# Patient Record
Sex: Male | Born: 1950 | Race: White | Hispanic: No | Marital: Married | Smoking: Never smoker
Health system: Southern US, Community
[De-identification: ages and names within clinical notes are randomized; demographics above are authoritative.]

## PROBLEM LIST (undated history)

## (undated) DIAGNOSIS — R7303 Prediabetes: Secondary | ICD-10-CM

## (undated) DIAGNOSIS — Z87442 Personal history of urinary calculi: Secondary | ICD-10-CM

## (undated) DIAGNOSIS — N289 Disorder of kidney and ureter, unspecified: Secondary | ICD-10-CM

## (undated) DIAGNOSIS — M1990 Unspecified osteoarthritis, unspecified site: Secondary | ICD-10-CM

## (undated) DIAGNOSIS — E785 Hyperlipidemia, unspecified: Secondary | ICD-10-CM

## (undated) DIAGNOSIS — N200 Calculus of kidney: Secondary | ICD-10-CM

## (undated) DIAGNOSIS — E039 Hypothyroidism, unspecified: Secondary | ICD-10-CM

## (undated) DIAGNOSIS — I10 Essential (primary) hypertension: Secondary | ICD-10-CM

## (undated) DIAGNOSIS — G629 Polyneuropathy, unspecified: Secondary | ICD-10-CM

## (undated) HISTORY — PX: BACK SURGERY: SHX140

## (undated) HISTORY — PX: NECK SURGERY: SHX720

## (undated) HISTORY — DX: Hypothyroidism, unspecified: E03.9

## (undated) HISTORY — DX: Polyneuropathy, unspecified: G62.9

## (undated) HISTORY — DX: Hyperlipidemia, unspecified: E78.5

## (undated) HISTORY — DX: Calculus of kidney: N20.0

## (undated) HISTORY — DX: Disorder of kidney and ureter, unspecified: N28.9

## (undated) HISTORY — PX: OTHER SURGICAL HISTORY: SHX169

---

## 2017-09-11 DIAGNOSIS — I10 Essential (primary) hypertension: Secondary | ICD-10-CM | POA: Diagnosis not present

## 2017-09-11 DIAGNOSIS — G629 Polyneuropathy, unspecified: Secondary | ICD-10-CM | POA: Diagnosis not present

## 2017-09-11 DIAGNOSIS — I8393 Asymptomatic varicose veins of bilateral lower extremities: Secondary | ICD-10-CM | POA: Diagnosis not present

## 2017-09-11 DIAGNOSIS — Z9889 Other specified postprocedural states: Secondary | ICD-10-CM | POA: Diagnosis not present

## 2017-09-11 DIAGNOSIS — E039 Hypothyroidism, unspecified: Secondary | ICD-10-CM | POA: Diagnosis not present

## 2017-09-11 DIAGNOSIS — E782 Mixed hyperlipidemia: Secondary | ICD-10-CM | POA: Diagnosis not present

## 2017-09-11 DIAGNOSIS — R600 Localized edema: Secondary | ICD-10-CM | POA: Diagnosis not present

## 2017-09-11 DIAGNOSIS — F5101 Primary insomnia: Secondary | ICD-10-CM | POA: Diagnosis not present

## 2017-09-26 DIAGNOSIS — E782 Mixed hyperlipidemia: Secondary | ICD-10-CM | POA: Diagnosis not present

## 2017-09-26 DIAGNOSIS — M25562 Pain in left knee: Secondary | ICD-10-CM | POA: Diagnosis not present

## 2018-03-10 DIAGNOSIS — J019 Acute sinusitis, unspecified: Secondary | ICD-10-CM | POA: Diagnosis not present

## 2018-06-27 DIAGNOSIS — E782 Mixed hyperlipidemia: Secondary | ICD-10-CM | POA: Diagnosis not present

## 2018-06-27 DIAGNOSIS — F5101 Primary insomnia: Secondary | ICD-10-CM | POA: Diagnosis not present

## 2018-06-27 DIAGNOSIS — E039 Hypothyroidism, unspecified: Secondary | ICD-10-CM | POA: Diagnosis not present

## 2018-06-27 DIAGNOSIS — I10 Essential (primary) hypertension: Secondary | ICD-10-CM | POA: Diagnosis not present

## 2018-06-27 DIAGNOSIS — Z0000 Encounter for general adult medical examination without abnormal findings: Secondary | ICD-10-CM | POA: Diagnosis not present

## 2018-06-27 DIAGNOSIS — Z79899 Other long term (current) drug therapy: Secondary | ICD-10-CM | POA: Diagnosis not present

## 2018-06-27 DIAGNOSIS — Z1159 Encounter for screening for other viral diseases: Secondary | ICD-10-CM | POA: Diagnosis not present

## 2018-06-27 DIAGNOSIS — Z125 Encounter for screening for malignant neoplasm of prostate: Secondary | ICD-10-CM | POA: Diagnosis not present

## 2018-06-27 DIAGNOSIS — G629 Polyneuropathy, unspecified: Secondary | ICD-10-CM | POA: Diagnosis not present

## 2018-06-27 DIAGNOSIS — I8393 Asymptomatic varicose veins of bilateral lower extremities: Secondary | ICD-10-CM | POA: Diagnosis not present

## 2018-10-15 ENCOUNTER — Encounter: Payer: Self-pay | Admitting: Emergency Medicine

## 2018-10-15 ENCOUNTER — Other Ambulatory Visit: Payer: Self-pay

## 2018-10-15 ENCOUNTER — Ambulatory Visit
Admission: EM | Admit: 2018-10-15 | Discharge: 2018-10-15 | Disposition: A | Discharge status: Home / Self Care | Payer: Medicare HMO | Attending: Emergency Medicine | Admitting: Emergency Medicine

## 2018-10-15 DIAGNOSIS — N10 Acute tubulo-interstitial nephritis: Secondary | ICD-10-CM | POA: Insufficient documentation

## 2018-10-15 DIAGNOSIS — B9689 Other specified bacterial agents as the cause of diseases classified elsewhere: Secondary | ICD-10-CM

## 2018-10-15 HISTORY — DX: Disorder of kidney and ureter, unspecified: N28.9

## 2018-10-15 HISTORY — DX: Essential (primary) hypertension: I10

## 2018-10-15 LAB — POCT URINALYSIS DIP (MANUAL ENTRY)
Glucose, UA: NEGATIVE mg/dL
Ketones, POC UA: NEGATIVE mg/dL
Nitrite, UA: POSITIVE — AB
Protein Ur, POC: >=300 mg/dL — AB
Spec Grav, UA: >=1.03 — AB (ref 1.010–1.025)
Urobilinogen, UA: 1 E.U./dL
pH, UA: 5.5 (ref 5.0–8.0)

## 2018-10-15 MED ORDER — CIPROFLOXACIN HCL 500 MG PO TABS: 500.0000 mg | ORAL_TABLET | Freq: Every day | ORAL | 0 refills | Status: AC

## 2018-10-15 MED ORDER — ONDANSETRON 4 MG PO TBDP: 4.0000 mg | ORAL_TABLET | Freq: Three times a day (TID) | ORAL | 0 refills | Status: DC | PRN

## 2018-10-15 NOTE — ED Triage Notes (Addendum)
Patient presents to [facility] after having left sided flank pain on Saturday with a hx of kidney stones. Patient states he has been "out of it" ever since he took his vicodin and sleep pill Saturday to help with the pain.  Patient states his family noted a fever of 101 with chills today, so he came in.  C/o mild cough.  Patient also c/o 5 episodes of emesis in the last 24 hours, and not being able to eat since Saturday.  Unsteady on his feet during ambulation to room.

## 2018-10-15 NOTE — Discharge Instructions (Signed)
Take antibiotics as prescribed: 1 tab once daily. We will call you as soon as you are blood work comes back and may adjust the antibiotic dose frequency. A symptoms do not improve/worsen over the next 24 hours, recommend patient go to ER for further evaluation.

## 2018-10-15 NOTE — ED Notes (Signed)
Patient able to ambulate independently  This RN attempted for blood draw x 2 without success, Shanon Brow, RT made 2 attempts, successful on his second attempt.

## 2018-10-15 NOTE — ED Provider Notes (Signed)
[facility]    CSN: [id number] Arrival date & time: 10/15/18  1844      History   Chief Complaint Chief Complaint  Patient presents with   Fever    HPI Logan Shepherd is a 68 y.o. male with history of hypertension, kidney stones presenting for "feeling off ".  Patient initially complains of left-sided flank pain, though during interview with this provider denies flank pain.  Patient states that pain started out Thursday and left lower quadrant, patient took a leftover Vicodin on Saturday with pain alleviation.  Patient's wife was available for questioning: States he had a temperature of 101F earlier today, has not take in anything: 90.2 Fahrenheit on arrival.  Patient does endorse some nausea and vomiting earlier today without blood or bile.  States he has been able to keep down water: Denies dysuria, urinary frequency/urgency, hematuria.  Patient denies history of diverticulitis, renal failure, diabetes, pyelonephritis.  Patient's wife states that years ago patient had an obstructing renal calculi that required surgical intervention and led to a hospital course in which patient was septic.  Past Medical History:  Diagnosis Date   Hypertension    Renal disorder    kidney stones    There are no active problems to display for this patient.   Past Surgical History:  Procedure Laterality Date   BACK SURGERY     NECK SURGERY         Home Medications    Prior to Admission medications   Medication Sig Start Date End Date Taking? Authorizing Provider  ciprofloxacin (CIPRO) 500 MG tablet Take 1 tablet (500 mg total) by mouth daily with breakfast for 14 days. 10/15/18 10/29/18  Hall-Potvin, [place], PA-C  ondansetron (ZOFRAN ODT) 4 MG disintegrating tablet Take 1 tablet (4 mg total) by mouth every 8 (eight) hours as needed for nausea or vomiting. 10/15/18   Hall-Potvin, [place], PA-C    Family History History reviewed. No pertinent family history.  Social  History Social History   Tobacco Use   Smoking status: Never Smoker   Smokeless tobacco: Never Used  Substance Use Topics   Alcohol use: Not Currently    Frequency: Never   Drug use: Never     Allergies   Codeine, Keflex [cephalexin], Penicillins, and Nubain [nalbuphine]   Review of Systems Review of Systems  Constitutional: Positive for appetite change. Negative for activity change, fatigue and fever.  Respiratory: Negative for cough, shortness of breath and wheezing.   Cardiovascular: Negative for chest pain and palpitations.  Gastrointestinal: Positive for abdominal pain. Negative for abdominal distention, anal bleeding, blood in stool, constipation, diarrhea, nausea, rectal pain and vomiting.  Genitourinary: Negative for decreased urine volume, discharge, dysuria, enuresis, frequency, hematuria, penile pain, penile swelling, scrotal swelling, testicular pain and urgency.  Musculoskeletal: Negative for arthralgias, back pain, myalgias, neck pain and neck stiffness.  Skin: Negative for rash and wound.  Neurological: Positive for tremors. Negative for dizziness, syncope, facial asymmetry, speech difficulty, weakness, light-headedness, numbness and headaches.       Patient has history of tremors, though does state they have worsened  Psychiatric/Behavioral: Negative for confusion and hallucinations. The patient is not nervous/anxious.   All other systems reviewed and are negative.    Physical Exam Triage Vital Signs ED Triage Vitals [10/15/18 1854]  Enc Vitals Group     BP 109/69     Pulse Rate (!) 105     Resp 18     Temp 98.2 F (36.8 C)  Temp Source Oral     SpO2 94 %     Weight      Height      Head Circumference      Peak Flow      Pain Score 0     Pain Loc      Pain Edu?      Excl. in [place]?    No data found.  Updated Vital Signs BP 109/69 (BP Location: Right Arm)    Pulse (!) 105    Temp 98.2 F (36.8 C) (Oral)    Resp 18    SpO2 94%   Visual  Acuity Right Eye Distance:   Left Eye Distance:   Bilateral Distance:    Right Eye Near:   Left Eye Near:    Bilateral Near:     Physical Exam Constitutional:      General: He is not in acute distress. HENT:     Head: Normocephalic and atraumatic.     Mouth/Throat:     Mouth: Mucous membranes are moist.     Pharynx: Oropharynx is clear.  Eyes:     General: No scleral icterus.       Right eye: No discharge.        Left eye: No discharge.     Conjunctiva/sclera: Conjunctivae normal.     Pupils: Pupils are equal, round, and reactive to light.  Cardiovascular:     Rate and Rhythm: Regular rhythm. Tachycardia present.     Heart sounds: Normal heart sounds.  Pulmonary:     Effort: Pulmonary effort is normal. No respiratory distress.     Breath sounds: No wheezing.  Abdominal:     General: Bowel sounds are normal.     Tenderness: There is no right CVA tenderness, left CVA tenderness or guarding.     Comments: Umbilical hyperlipidemia noted: Approximately 3 cm wall deficit with protruding adipose tissue that is reducible, nontender.  Largely stable per patient.  Mild suprapubic tenderness without rebound.  Negative Murphy's, negative Rovsing signs.  Skin:    Capillary Refill: Capillary refill takes less than 2 seconds.     Coloration: Skin is not jaundiced or pale.  Neurological:     Mental Status: He is alert and oriented to person, place, and time.     Deep Tendon Reflexes: Reflexes normal.     Comments: Patient mildly tremulous in upper extremities bilaterally      UC Treatments / Results  Labs (all labs ordered are listed, but only abnormal results are displayed) Labs Reviewed  POCT URINALYSIS DIP (MANUAL ENTRY) - Abnormal; Notable for the following components:      Result Value   Clarity, UA cloudy (*)    Bilirubin, UA small (*)    Spec Grav, UA >=1.030 (*)    Blood, UA large (*)    Protein Ur, POC >=300 (*)    Nitrite, UA Positive (*)    Leukocytes, UA Small (1+)  (*)    All other components within normal limits  NOVEL CORONAVIRUS, NAA  URINE CULTURE  BASIC METABOLIC PANEL    EKG   Radiology No results found.  Procedures Procedures (including critical care time)  Medications Ordered in UC Medications - No data to display  Initial Impression / Assessment and Plan / UC Course  I have reviewed the triage vital signs and the nursing notes.  Pertinent labs & imaging results that were available during my care of the patient were reviewed by me and considered in  my medical decision making (see chart for details).     1.  Acute pyelonephritis POCT urinalysis done in office given history of renal calculi, reviewed by me: Grossly abnormal with small bilirubin, large blood, greater than 300 protein, small WBC, large nitrates.  Discussed possibility of renal calculi, though will treat for pyelonephritis.  Discussed importance of not taking pain medication (Vicodin) at this time as pain over the next 24 hours should be closely monitored, and an indicator to go to the ER for further evaluation/management.  Will treat empirically with ciprofloxacin, urine culture pending.  No previous renal function on file: Patient's wife denies hemodialysis.  States that he did have elevated creatinine in the past, though that was the time of his obstructive kidney stone and he got better.  Patient is unable to recall: This provider will treat at dose appropriate for CrCl <30: 250-500 mg PO q24h as listed below.  Patient's pharmacy close, 500 mg ciprofloxacin administered in office, which patient tolerated well.  BMP pending, will call wife with results tomorrow 8/25.  Discussed assessment/plan with wife who verbalized understanding: will take Cipro 500 mg once daily x14 days, unless renal function is not compromised, which case dose will be increased to 500 mg twice daily x7 days.  Return precautions discussed, patient and wife verbalized understanding and are agreeable to  plan. Final Clinical Impressions(s) / UC Diagnoses   Final diagnoses:  Acute pyelonephritis     Discharge Instructions     Take antibiotics as prescribed: 1 tab once daily. We will call you as soon as you are blood work comes back and may adjust the antibiotic dose frequency. A symptoms do not improve/worsen over the next 24 hours, recommend patient go to ER for further evaluation.    ED Prescriptions    Medication Sig Dispense Auth. Provider   ciprofloxacin (CIPRO) 500 MG tablet Take 1 tablet (500 mg total) by mouth daily with breakfast for 14 days. 14 tablet Hall-Potvin, [place], PA-C   ondansetron (ZOFRAN ODT) 4 MG disintegrating tablet Take 1 tablet (4 mg total) by mouth every 8 (eight) hours as needed for nausea or vomiting. 20 tablet Hall-Potvin, [place], PA-C     Controlled Substance Prescriptions [place] Controlled Substance Registry consulted? Not Applicable   Shea EvansHall-Potvin, Semya Klinke, Cordelia Poche-C 10/15/18 2031

## 2018-10-16 ENCOUNTER — Telehealth: Payer: Self-pay | Admitting: Emergency Medicine

## 2018-10-16 LAB — BASIC METABOLIC PANEL
BUN/Creatinine Ratio: 17 (ref 10–24)
BUN: 55 mg/dL — ABNORMAL HIGH (ref 8–27)
CO2: 20 mmol/L (ref 20–29)
Calcium: 9.9 mg/dL (ref 8.6–10.2)
Chloride: 96 mmol/L (ref 96–106)
Creatinine, Ser: 3.16 mg/dL — ABNORMAL HIGH (ref 0.76–1.27)
GFR calc Af Amer: 22 mL/min/{1.73_m2} — ABNORMAL LOW (ref >59)
GFR calc non Af Amer: 19 mL/min/{1.73_m2} — ABNORMAL LOW (ref >59)
Glucose: 131 mg/dL — ABNORMAL HIGH (ref 65–99)
Potassium: 4.8 mmol/L (ref 3.5–5.2)
Sodium: 132 mmol/L — ABNORMAL LOW (ref 134–144)

## 2018-10-16 LAB — NOVEL CORONAVIRUS, NAA: SARS-CoV-2, NAA: NOT DETECTED

## 2018-10-16 NOTE — Telephone Encounter (Signed)
Spoke with patient's wife, with patient's permission, regarding lab results.  Creatinine 3.16, EGFR 19, bun 55.  Per wife, patient weighs approximately 230 to 235 pounds.  Per Cockcroft-Gault calculation patient's creatinine clearance approximately 33-34 ml/min.  Per wife, patient is feeling a little better since ciprofloxacin dose last night, was able to take a.m. dose this morning after feeling prescription.  Discussed that based on estimated creatinine clearance, patient should be able to tolerate twice daily dosing for the next week.  Again, emphasized strict return precautions.  Patient's wife verbalized understanding, will increase ciprofloxacin dose from 500 mg daily x14 days to 500 mg twice daily x1 days.

## 2018-10-16 NOTE — Telephone Encounter (Signed)
Called patient's PCP office to obtain mobile number as home number goes straight to voicemail.  Left message on both home and mobile number to call back regarding lab results.

## 2018-10-18 LAB — URINE CULTURE: Culture: >=100000 — AB

## 2018-10-19 ENCOUNTER — Telehealth (HOSPITAL_COMMUNITY): Payer: Self-pay | Admitting: Emergency Medicine

## 2018-10-19 NOTE — Telephone Encounter (Signed)
Urine culture was positive for e coli and was given  cipro at urgent care visit. Attempted to reach patient. No answer at this time.   

## 2018-12-28 DIAGNOSIS — G629 Polyneuropathy, unspecified: Secondary | ICD-10-CM | POA: Diagnosis not present

## 2018-12-28 DIAGNOSIS — N1832 Chronic kidney disease, stage 3b: Secondary | ICD-10-CM | POA: Diagnosis not present

## 2018-12-28 DIAGNOSIS — Z23 Encounter for immunization: Secondary | ICD-10-CM | POA: Diagnosis not present

## 2018-12-28 DIAGNOSIS — E039 Hypothyroidism, unspecified: Secondary | ICD-10-CM | POA: Diagnosis not present

## 2018-12-28 DIAGNOSIS — I10 Essential (primary) hypertension: Secondary | ICD-10-CM | POA: Diagnosis not present

## 2018-12-28 DIAGNOSIS — E782 Mixed hyperlipidemia: Secondary | ICD-10-CM | POA: Diagnosis not present

## 2019-02-06 DIAGNOSIS — I10 Essential (primary) hypertension: Secondary | ICD-10-CM | POA: Diagnosis not present

## 2019-03-11 DIAGNOSIS — I10 Essential (primary) hypertension: Secondary | ICD-10-CM | POA: Diagnosis not present

## 2019-04-05 ENCOUNTER — Ambulatory Visit: Payer: Medicare HMO | Attending: Internal Medicine

## 2019-04-05 DIAGNOSIS — Z23 Encounter for immunization: Secondary | ICD-10-CM | POA: Insufficient documentation

## 2019-04-05 DIAGNOSIS — R7401 Elevation of levels of liver transaminase levels: Secondary | ICD-10-CM | POA: Diagnosis not present

## 2019-04-05 NOTE — Progress Notes (Signed)
   Covid-19 Vaccination Clinic  Name:  Logan Shepherd    MRN: [id number] DOB: 06-26-1950  04/05/2019  [name] was observed post Covid-19 immunization for 15 minutes without incidence. He was provided with Vaccine Information Sheet and instruction to access the V-Safe system.   [name] was instructed to call 911 with any severe reactions post vaccine: Marland Kitchen Difficulty breathing  . Swelling of your face and throat  . A fast heartbeat  . A bad rash all over your body  . Dizziness and weakness    Immunizations Administered    Name Date Dose VIS Date Route   Pfizer COVID-19 Vaccine 04/05/2019  3:56 PM 0.3 mL 02/01/2019 Intramuscular   Manufacturer: [employer], [employer]   Lot: [id number]   NDC: [address]-0

## 2019-04-28 ENCOUNTER — Ambulatory Visit: Payer: Medicare HMO | Attending: Internal Medicine

## 2019-04-28 DIAGNOSIS — Z23 Encounter for immunization: Secondary | ICD-10-CM | POA: Insufficient documentation

## 2019-04-28 NOTE — Progress Notes (Signed)
   Covid-19 Vaccination Clinic  Name:  Logan Shepherd    MRN: [id number] DOB: 03-20-1950  04/28/2019  Logan Shepherd was observed post Covid-19 immunization for 15 minutes without incident. He was provided with Vaccine Information Sheet and instruction to access the V-Safe system.   Logan Shepherd was instructed to call 911 with any severe reactions post vaccine: Marland Kitchen Difficulty breathing  . Swelling of face and throat  . A fast heartbeat  . A bad rash all over body  . Dizziness and weakness   Immunizations Administered    Name Date Dose VIS Date Route   Pfizer COVID-19 Vaccine 04/28/2019  1:20 PM 0.3 mL 02/01/2019 Intramuscular   Manufacturer: [employer], [employer]   Lot: [id number]   NDC: [address]-4

## 2019-09-26 DIAGNOSIS — E782 Mixed hyperlipidemia: Secondary | ICD-10-CM | POA: Diagnosis not present

## 2019-09-26 DIAGNOSIS — E039 Hypothyroidism, unspecified: Secondary | ICD-10-CM | POA: Diagnosis not present

## 2019-09-26 DIAGNOSIS — F5101 Primary insomnia: Secondary | ICD-10-CM | POA: Diagnosis not present

## 2019-09-26 DIAGNOSIS — R600 Localized edema: Secondary | ICD-10-CM | POA: Diagnosis not present

## 2019-09-26 DIAGNOSIS — Z125 Encounter for screening for malignant neoplasm of prostate: Secondary | ICD-10-CM | POA: Diagnosis not present

## 2019-09-26 DIAGNOSIS — Z0001 Encounter for general adult medical examination with abnormal findings: Secondary | ICD-10-CM | POA: Diagnosis not present

## 2019-09-26 DIAGNOSIS — R7309 Other abnormal glucose: Secondary | ICD-10-CM | POA: Diagnosis not present

## 2019-09-26 DIAGNOSIS — I10 Essential (primary) hypertension: Secondary | ICD-10-CM | POA: Diagnosis not present

## 2019-09-26 DIAGNOSIS — Z23 Encounter for immunization: Secondary | ICD-10-CM | POA: Diagnosis not present

## 2019-09-26 DIAGNOSIS — Z79899 Other long term (current) drug therapy: Secondary | ICD-10-CM | POA: Diagnosis not present

## 2019-10-22 ENCOUNTER — Ambulatory Visit (HOSPITAL_COMMUNITY)
Admission: RE | Admit: 2019-10-22 | Discharge: 2019-10-22 | Disposition: A | Discharge status: Home / Self Care | Payer: Medicare Other | Source: Ambulatory Visit | Attending: Pulmonary Disease | Admitting: Pulmonary Disease

## 2019-10-22 ENCOUNTER — Other Ambulatory Visit: Payer: Self-pay | Admitting: Nurse Practitioner

## 2019-10-22 ENCOUNTER — Telehealth: Payer: Self-pay | Admitting: Nurse Practitioner

## 2019-10-22 DIAGNOSIS — U071 COVID-19: Secondary | ICD-10-CM | POA: Diagnosis present

## 2019-10-22 DIAGNOSIS — Z23 Encounter for immunization: Secondary | ICD-10-CM | POA: Insufficient documentation

## 2019-10-22 MED ORDER — SODIUM CHLORIDE 0.9 % IV SOLN: INTRAVENOUS | Status: DC | PRN

## 2019-10-22 MED ORDER — EPINEPHRINE 0.3 MG/0.3ML IJ SOAJ: 0.3000 mg | Freq: Once | INTRAMUSCULAR | Status: DC | PRN

## 2019-10-22 MED ORDER — ALBUTEROL SULFATE HFA 108 (90 BASE) MCG/ACT IN AERS: 2.0000 | INHALATION_SPRAY | Freq: Once | RESPIRATORY_TRACT | Status: DC | PRN

## 2019-10-22 MED ORDER — SODIUM CHLORIDE 0.9 % IV SOLN
1200.0000 mg | Freq: Once | INTRAVENOUS | Status: AC
  Administered 2019-10-22: 1200 mg via INTRAVENOUS

## 2019-10-22 MED ORDER — METHYLPREDNISOLONE SODIUM SUCC 125 MG IJ SOLR: 125.0000 mg | Freq: Once | INTRAMUSCULAR | Status: DC | PRN

## 2019-10-22 MED ORDER — FAMOTIDINE IN NACL 20-0.9 MG/50ML-% IV SOLN: 20.0000 mg | Freq: Once | INTRAVENOUS | Status: DC | PRN

## 2019-10-22 MED ORDER — DIPHENHYDRAMINE HCL 50 MG/ML IJ SOLN: 50.0000 mg | Freq: Once | INTRAMUSCULAR | Status: DC | PRN

## 2019-10-22 NOTE — Progress Notes (Signed)
  Diagnosis: COVID-19  Physician:Dr Wright  Procedure: Covid Infusion Clinic Med: casirivimab\imdevimab infusion - Provided patient with casirivimab\imdevimab fact sheet for patients, parents and caregivers prior to infusion.  Complications: No immediate complications noted.  Discharge: Discharged home   Logan Shepherd 10/22/2019  

## 2019-10-22 NOTE — Telephone Encounter (Signed)
I connected by phone with Logan Shepherd on 10/22/2019 at 1:30 PM to discuss the potential use of a new treatment for mild to moderate COVID-19 viral infection in non-hospitalized patients.  This patient is a 69 y.o. male that meets the FDA criteria for Emergency Use Authorization of COVID monoclonal antibody casirivimab/imdevimab.  Has a (+) direct SARS-CoV-2 viral test result  Has mild or moderate COVID-19   Is NOT hospitalized due to COVID-19  Is within 10 days of symptom onset  Has at least one of the high risk factor(s) for progression to severe COVID-19 and/or hospitalization as defined in EUA.  Specific high risk criteria : Older age (>/= 69 yo)   I have spoken and communicated the following to the patient or parent/caregiver regarding COVID monoclonal antibody treatment:  1. FDA has authorized the emergency use for the treatment of mild to moderate COVID-19 in adults and pediatric patients with positive results of direct SARS-CoV-2 viral testing who are 82 years of age and older weighing at least 40 kg, and who are at high risk for progressing to severe COVID-19 and/or hospitalization.  2. The significant known and potential risks and benefits of COVID monoclonal antibody, and the extent to which such potential risks and benefits are unknown.  3. Information on available alternative treatments and the risks and benefits of those alternatives, including clinical trials.  4. Patients treated with COVID monoclonal antibody should continue to self-isolate and use infection control measures (e.g., wear mask, isolate, social distance, avoid sharing personal items, clean and disinfect "high touch" surfaces, and frequent handwashing) according to CDC guidelines.   5. The patient or parent/caregiver has the option to accept or refuse COVID monoclonal antibody treatment.  After reviewing this information with the patient, The patient agreed to proceed with receiving casirivimab\imdevimab  infusion and will be provided a copy of the Fact sheet prior to receiving the infusion.   [name] 10/22/2019 1:30 PM

## 2019-10-22 NOTE — Discharge Instructions (Signed)

## 2019-10-22 NOTE — Progress Notes (Signed)
I connected by phone with Logan Shepherd on 10/22/2019 at 1:32 PM to discuss the potential use of a new treatment for mild to moderate COVID-19 viral infection in non-hospitalized patients.  This patient is a 69 y.o. male that meets the FDA criteria for Emergency Use Authorization of COVID monoclonal antibody casirivimab/imdevimab.  Has a (+) direct SARS-CoV-2 viral test result  Has mild or moderate COVID-19   Is NOT hospitalized due to COVID-19  Is within 10 days of symptom onset  Has at least one of the high risk factor(s) for progression to severe COVID-19 and/or hospitalization as defined in EUA.  Specific high risk criteria : Older age (>/= 69 yo)   I have spoken and communicated the following to the patient or parent/caregiver regarding COVID monoclonal antibody treatment:  1. FDA has authorized the emergency use for the treatment of mild to moderate COVID-19 in adults and pediatric patients with positive results of direct SARS-CoV-2 viral testing who are 64 years of age and older weighing at least 40 kg, and who are at high risk for progressing to severe COVID-19 and/or hospitalization.  2. The significant known and potential risks and benefits of COVID monoclonal antibody, and the extent to which such potential risks and benefits are unknown.  3. Information on available alternative treatments and the risks and benefits of those alternatives, including clinical trials.  4. Patients treated with COVID monoclonal antibody should continue to self-isolate and use infection control measures (e.g., wear mask, isolate, social distance, avoid sharing personal items, clean and disinfect "high touch" surfaces, and frequent handwashing) according to CDC guidelines.   5. The patient or parent/caregiver has the option to accept or refuse COVID monoclonal antibody treatment.  After reviewing this information with the patient, The patient agreed to proceed with receiving casirivimab\imdevimab  infusion and will be provided a copy of the Fact sheet prior to receiving the infusion.   Logan Shepherd 10/22/2019 1:32 PM

## 2020-01-21 ENCOUNTER — Ambulatory Visit: Payer: Medicare HMO | Attending: Internal Medicine

## 2020-01-21 DIAGNOSIS — Z23 Encounter for immunization: Secondary | ICD-10-CM

## 2020-01-21 NOTE — Progress Notes (Signed)
   Covid-19 Vaccination Clinic  Name:  Logan Shepherd    MRN: [id number] DOB: 1950-04-24  01/21/2020  Logan Shepherd was observed post Covid-19 immunization for 15 minutes without incident. He was provided with Vaccine Information Sheet and instruction to access the V-Safe system.   Logan Shepherd was instructed to call 911 with any severe reactions post vaccine: Marland Kitchen Difficulty breathing  . Swelling of face and throat  . A fast heartbeat  . A bad rash all over body  . Dizziness and weakness   Immunizations Administered    Name Date Dose VIS Date Route   Pfizer COVID-19 Vaccine 01/21/2020  2:54 PM 0.3 mL 12/11/2019 Intramuscular   Manufacturer: [employer], [employer]   Lot: [id number]   NDC: [address]-3

## 2020-01-24 DIAGNOSIS — Z23 Encounter for immunization: Secondary | ICD-10-CM | POA: Diagnosis not present

## 2020-03-30 DIAGNOSIS — G629 Polyneuropathy, unspecified: Secondary | ICD-10-CM | POA: Diagnosis not present

## 2020-03-30 DIAGNOSIS — R600 Localized edema: Secondary | ICD-10-CM | POA: Diagnosis not present

## 2020-03-30 DIAGNOSIS — N1832 Chronic kidney disease, stage 3b: Secondary | ICD-10-CM | POA: Diagnosis not present

## 2020-03-30 DIAGNOSIS — F5101 Primary insomnia: Secondary | ICD-10-CM | POA: Diagnosis not present

## 2020-03-30 DIAGNOSIS — E039 Hypothyroidism, unspecified: Secondary | ICD-10-CM | POA: Diagnosis not present

## 2020-03-30 DIAGNOSIS — Z9889 Other specified postprocedural states: Secondary | ICD-10-CM | POA: Diagnosis not present

## 2020-03-30 DIAGNOSIS — I10 Essential (primary) hypertension: Secondary | ICD-10-CM | POA: Diagnosis not present

## 2020-03-30 DIAGNOSIS — E782 Mixed hyperlipidemia: Secondary | ICD-10-CM | POA: Diagnosis not present

## 2020-03-30 DIAGNOSIS — Z79899 Other long term (current) drug therapy: Secondary | ICD-10-CM | POA: Diagnosis not present

## 2020-04-16 ENCOUNTER — Encounter: Payer: Self-pay | Admitting: Neurology

## 2020-06-19 DIAGNOSIS — R7301 Impaired fasting glucose: Secondary | ICD-10-CM | POA: Diagnosis not present

## 2020-06-19 DIAGNOSIS — E7800 Pure hypercholesterolemia, unspecified: Secondary | ICD-10-CM | POA: Diagnosis not present

## 2020-07-08 NOTE — Progress Notes (Deleted)
Assessment/Plan:   1.  Essential Tremor.  -This is evidenced by the symmetrical nature and longstanding hx of gradually getting worse.  We discussed nature and pathophysiology.  We discussed that this can continue to gradually get worse with time.  We discussed that some medications can worsen this, as can caffeine use.  We discussed medication therapy as well as surgical therapy.  Ultimately, the patient decided to ***.     Subjective:   Logan Shepherd was seen in consultation in the movement disorder clinic at the request of Koirala, Dibas, MD.  The evaluation is for tremor.  Medical records made available to me are reviewed.  Tremor started approximately *** ago and involves the ***head and hands.  Tremor is most noticeable when ***.   There is *** family hx of tremor.    Affected by caffeine:  {yes no:314532} Affected by alcohol:  {yes no:314532} Affected by stress:  {yes no:314532} Affected by fatigue:  {yes no:314532} Spills soup if on spoon:  {yes no:314532} Spills glass of liquid if full:  {yes no:314532} Affects ADL's (tying shoes, brushing teeth, etc):  {yes no:314532}  Current/Previously tried tremor medications: ***  Current medications that may exacerbate tremor:  ***  Outside reports reviewed: {Outside review:15817}.  No history of prior neuroimaging of the brain.  Allergies  Allergen Reactions  . Codeine Nausea Only  . Keflex [Cephalexin] Nausea Only  . Penicillins Swelling  . Nubain [Nalbuphine] Anxiety    Current Outpatient Medications  Medication Instructions  . ondansetron (ZOFRAN ODT) 4 mg, Oral, Every 8 hours PRN     Objective:   VITALS:  There were no vitals filed for this visit. Gen:  Appears stated age and in NAD. HEENT:  Normocephalic, atraumatic. The mucous membranes are moist. The superficial temporal arteries are without ropiness or tenderness. Cardiovascular: Regular rate and rhythm. Lungs: Clear to auscultation bilaterally. Neck: There are  no carotid bruits noted bilaterally.  NEUROLOGICAL:  Orientation:  The patient is alert and oriented x 3.   Cranial nerves: There is good facial symmetry. Extraocular muscles are intact and visual fields are full to confrontational testing. Speech is fluent and clear. Soft palate rises symmetrically and there is no tongue deviation. Hearing is intact to conversational tone. Tone: Tone is good throughout. Sensation: Sensation is intact to light touch touch throughout (facial, trunk, extremities). Vibration is intact at the bilateral big toe. There is no extinction with double simultaneous stimulation. There is no sensory dermatomal level identified. Coordination:  The patient has no dysdiadichokinesia or dysmetria. Motor: Strength is 5/5 in the bilateral upper and lower extremities.  Shoulder shrug is equal bilaterally.  There is no pronator drift.  There are no fasciculations noted. DTR's: Deep tendon reflexes are 2/4 at the bilateral biceps, triceps, brachioradialis, patella and achilles.  Plantar responses are downgoing bilaterally. Gait and Station: The patient is able to ambulate without difficulty. The patient is able to heel toe walk without any difficulty. The patient is able to ambulate in a tandem fashion. The patient is able to stand in the Romberg position.   MOVEMENT EXAM: Tremor:  There is *** tremor in the UE, noted most significantly with action.  The patient is *** able to draw Archimedes spirals without significant difficulty.  There is *** tremor at rest.  The patient is *** able to pour water from one glass to another without spilling it.  I have reviewed and interpreted the following labs independently   Chemistry  Component Value Date/Time   NA 132 (L) 10/15/2018 2032   K 4.8 10/15/2018 2032   CL 96 10/15/2018 2032   CO2 20 10/15/2018 2032   BUN 55 (H) 10/15/2018 2032   CREATININE 3.16 (H) 10/15/2018 2032      Component Value Date/Time   CALCIUM 9.9 10/15/2018 2032       No results found for: WBC, HGB, HCT, MCV, PLT No results found for: TSH Note lab work is sent by primary care for review.   Total time spent on today's visit was ***60 minutes, including both face-to-face time and nonface-to-face time.  Time included that spent on review of records (prior notes available to me/labs/imaging if pertinent), discussing treatment and goals, answering patient's questions and coordinating care.  CC:  [place] [place]

## 2020-07-10 ENCOUNTER — Ambulatory Visit: Payer: Medicare HMO | Admitting: Neurology

## 2020-07-21 NOTE — Progress Notes (Signed)
Assessment/Plan:   1.  Cervical Dystonia  -I talked to the patient about the nature and pathophysiology.  The patient is having trouble with ADL's and with rotation of the head in daily life for driving.  The primary muscles involved are the left SCM, right splenius capitis.  We talked about treatments.  We talked about the value of botox.  The patient was educated on the botulinum toxin the black blox warning and given a copy of the botox patient medication guide.  The patient understands that this warning states that there have been reported cases of the Botox extending beyond the injection site and creating adverse effects, similar to those of botulism. This included loss of strength, trouble walking, hoarseness, trouble saying words clearly, loss of bladder control, trouble breathing, trouble swallowing, diplopia, blurry vision and ptosis. Most of the distant spread of Botox was happening in patients, primarily children, who received medication for spasticity or for cervical dystonia. The patient expressed understanding but did not want to proceed right now, primarily because of cost issues.  He can certainly let us know if he would at least like to get prior authorization and go from there.  -Did tell him it is possible that cervical dystonia is related to the multiple neck surgeries.  -The biggest reason to consider Botox would be the very limited head rotation (so much so that he has nearly quit driving).  -Hand tremor is likely dystonic as well.  Did not see a lot of evidence of essential tremor, but given the fact the propranolol worked (but nephrologist did not want him on that), we decided to go ahead and try primidone and see if that would be any of benefit.  We will just start with a very low dose, 50 mg at bedtime.  Discussed risk, benefits, and side effects.  Patient would like to proceed.   Subjective:   Logan Shepherd was seen in consultation in the movement disorder clinic at the  request of Koirala, Dibas, MD.  The evaluation is for tremor.  Medical records made available to me are reviewed.  Tremor started approximately 10+ years ago and involves the head and hands.  He is not sure which started first.  Over the last few months, writing has gotten worse and has to concentrate to write.  The R and left hand shake equally.  He is R hand dominant.  He thinks that head shakes in the "yes" direction.   Has had 4 neck sx and has limited motion in the neck and driving is limited because of that.  There is no family hx of tremor.    Affected by caffeine:  No. (couple cokes per day) Affected by alcohol: doesn't drink alcohol Affected by stress:  No. Affected by fatigue:  No. Spills soup if on spoon:  Probably will Spills glass of liquid if full:  Probably will - uses cups with lipds Affects ADL's (tying shoes, brushing teeth, etc):  No., still shaves some with blade  Current/Previously tried tremor medications: propranolol (was on it in 2015 for several years for tremor but nephrologist d/c it) - it did help   Outside reports reviewed: historical medical records, lab reports, office notes and referral letter/letters.  No history of prior neuroimaging of the brain.  Allergies  Allergen Reactions  . Codeine Nausea Only  . Keflex [Cephalexin] Nausea Only  . Penicillins Swelling  . Nubain [Nalbuphine] Anxiety    Current Outpatient Medications  Medication Instructions  . atorvastatin (LIPITOR) 20  MG tablet 1 tablet  . cyclobenzaprine (FLEXERIL) 5 MG tablet 1 tablet as needed  . furosemide (LASIX) 20 MG tablet 1 tablet, Oral, Daily  . gabapentin (NEURONTIN) 300 MG capsule 1 capsule  . levothyroxine (SYNTHROID) 50 MCG tablet TAKE 1 TABLET IN THE MORNING ON AN EMPTY STOMACH  . lisinopril (ZESTRIL) 10 MG tablet TAKE 1 TABLET EVERY DAY (PLEASE KEEP APPOINTMENT FOR 12/02/2019)     Objective:   VITALS:   Vitals:   07/23/20 1322  BP: 118/62  Pulse: 74  SpO2: 96%  Weight:  244 lb (110.7 kg)  Height: 6' (1.829 m)   Gen:  Appears stated age and in NAD. HEENT:  Normocephalic, atraumatic. The mucous membranes are moist. The superficial temporal arteries are without ropiness or tenderness. Cardiovascular: Regular rate and rhythm. Lungs: Clear to auscultation bilaterally. Neck: There are no carotid bruits noted bilaterally.  Neck is turned to the right and slightly SB to the left.  There is hypertrophy of the left SCM.  Head rotation is limited to the left.  NEUROLOGICAL:  Orientation:  The patient is alert and oriented x 3.   Cranial nerves: There is good facial symmetry. Extraocular muscles are intact and visual fields are full to confrontational testing. Speech is fluent and clear. Soft palate rises symmetrically and there is no tongue deviation. Hearing is intact to conversational tone. Tone: Tone is good throughout. Sensation: Sensation is intact to light touch touch throughout (facial, trunk, extremities). Vibration is intact at the bilateral big toe. There is no extinction with double simultaneous stimulation. There is no sensory dermatomal level identified. Coordination:  The patient has no dysdiadichokinesia or dysmetria. Motor: Strength is 5/5 in the bilateral upper and lower extremities.  Shoulder shrug is equal bilaterally.  There is no pronator drift.  There are no fasciculations noted. DTR's: Deep tendon reflexes are 1/4 at the bilateral biceps, triceps, brachioradialis, patella and achilles.  Plantar responses are downgoing bilaterally. Gait and Station: The patient is able to ambulate without difficulty.   MOVEMENT EXAM: Tremor:  There is min postural tremor.  No significant tremor.  Min trouble with pouring water from one glass to another.   The patient is  able to draw Archimedes spirals without significant difficulty.  There is no tremor at rest.  There is head tremor that is very irregular and jerking in quality.  I have reviewed and interpreted the  following labs independently   Chemistry      Component Value Date/Time   NA 132 (L) 10/15/2018 2032   K 4.8 10/15/2018 2032   CL 96 10/15/2018 2032   CO2 20 10/15/2018 2032   BUN 55 (H) 10/15/2018 2032   CREATININE 3.16 (H) 10/15/2018 2032      Component Value Date/Time   CALCIUM 9.9 10/15/2018 2032      No results found for: WBC, HGB, HCT, MCV, PLT No results found for: TSH Note lab work is sent by primary care for review.   Total time spent on today's visit was 60 minutes, including both face-to-face time and nonface-to-face time.  Time included that spent on review of records (prior notes available to me/labs/imaging if pertinent), discussing treatment and goals, answering patient's questions and coordinating care.  CC:  Koirala, Dibas, MD

## 2020-07-23 ENCOUNTER — Encounter: Payer: Self-pay | Admitting: Neurology

## 2020-07-23 ENCOUNTER — Other Ambulatory Visit: Payer: Self-pay

## 2020-07-23 ENCOUNTER — Ambulatory Visit (INDEPENDENT_AMBULATORY_CARE_PROVIDER_SITE_OTHER): Payer: Medicare HMO | Admitting: Neurology

## 2020-07-23 VITALS — BP 118/62 | HR 74 | Ht 72.0 in | Wt 244.0 lb

## 2020-07-23 DIAGNOSIS — R251 Tremor, unspecified: Secondary | ICD-10-CM | POA: Diagnosis not present

## 2020-07-23 DIAGNOSIS — G243 Spasmodic torticollis: Secondary | ICD-10-CM

## 2020-07-23 MED ORDER — PRIMIDONE 50 MG PO TABS: 50.0000 mg | ORAL_TABLET | Freq: Every day | ORAL | 1 refills | Status: DC

## 2020-07-23 NOTE — Patient Instructions (Addendum)
1.  You have cervical dystonia.  The best treatment for this is botox.  For now, you told me you want to hold off on this.  You can let me know if you change your mind and we can try to get authorization.  2.  Start primidone 50 mg - 1/2 tablet at bedtime for 1 week and then increase to 1 tablet at bedtime thereafter.  The physicians and staff at [facility] are committed to providing excellent care. You may receive a survey requesting feedback about your experience at our office. We strive to receive "very good" responses to the survey questions. If you feel that your experience would prevent you from giving the office a "very good " response, please contact our office to try to remedy the situation. We may be reached at [phone]. Thank you for taking the time out of your busy day to complete the survey.

## 2020-07-24 ENCOUNTER — Telehealth: Payer: Self-pay | Admitting: Neurology

## 2020-07-24 ENCOUNTER — Other Ambulatory Visit: Payer: Self-pay

## 2020-07-24 MED ORDER — PRIMIDONE 50 MG PO TABS: 50.0000 mg | ORAL_TABLET | Freq: Every day | ORAL | 1 refills | Status: DC

## 2020-07-24 NOTE — Telephone Encounter (Signed)
Patient's wife called in stating the primidone was sent to [facility] pharmacy, but they would like it to be sent to the [facility] mail order.

## 2020-07-24 NOTE — Telephone Encounter (Signed)
Primidone sent to [facility]

## 2020-08-14 DIAGNOSIS — M109 Gout, unspecified: Secondary | ICD-10-CM | POA: Diagnosis not present

## 2020-10-15 DIAGNOSIS — Z0001 Encounter for general adult medical examination with abnormal findings: Secondary | ICD-10-CM | POA: Diagnosis not present

## 2020-10-15 DIAGNOSIS — R7309 Other abnormal glucose: Secondary | ICD-10-CM | POA: Diagnosis not present

## 2020-10-15 DIAGNOSIS — R600 Localized edema: Secondary | ICD-10-CM | POA: Diagnosis not present

## 2020-10-15 DIAGNOSIS — Z125 Encounter for screening for malignant neoplasm of prostate: Secondary | ICD-10-CM | POA: Diagnosis not present

## 2020-10-15 DIAGNOSIS — R7303 Prediabetes: Secondary | ICD-10-CM | POA: Diagnosis not present

## 2020-10-15 DIAGNOSIS — E039 Hypothyroidism, unspecified: Secondary | ICD-10-CM | POA: Diagnosis not present

## 2020-10-15 DIAGNOSIS — I10 Essential (primary) hypertension: Secondary | ICD-10-CM | POA: Diagnosis not present

## 2020-10-15 DIAGNOSIS — I8393 Asymptomatic varicose veins of bilateral lower extremities: Secondary | ICD-10-CM | POA: Diagnosis not present

## 2020-10-15 DIAGNOSIS — Z79899 Other long term (current) drug therapy: Secondary | ICD-10-CM | POA: Diagnosis not present

## 2021-01-26 ENCOUNTER — Other Ambulatory Visit: Payer: Self-pay

## 2021-01-26 ENCOUNTER — Ambulatory Visit
Admission: EM | Admit: 2021-01-26 | Discharge: 2021-01-26 | Disposition: A | Discharge status: Home / Self Care | Payer: Medicare HMO | Attending: Physician Assistant | Admitting: Physician Assistant

## 2021-01-26 DIAGNOSIS — M109 Gout, unspecified: Secondary | ICD-10-CM

## 2021-01-26 MED ORDER — PREDNISONE 20 MG PO TABS: 40.0000 mg | ORAL_TABLET | Freq: Every day | ORAL | 0 refills | Status: AC

## 2021-01-26 NOTE — ED Triage Notes (Signed)
Pt c/o pain and swelling to lt foot x2days. Hx of gout.

## 2021-01-26 NOTE — Progress Notes (Signed)
Assessment/Plan:    1.  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  He is possibly considering botox in the future but looking into different insurance plans  2.  F/u 9 months-1 year  Subjective:   Logan Shepherd was seen today in follow up for dystonic tremor.  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  He did not want to trial Botox for cervical dystonia.  We decided to try primidone at low-dose last visit, 50 mg daily.  Pt states that it is really helping - "I can write a check and read it now."  No SE with the medication.    Current prescribed movement disorder medications: Primidone 50 mg daily   PREVIOUS MEDICATIONS: propranolol (was on it in 2015 for several years and it helped, but apparently his nephrologist did not want him on it)  ALLERGIES:   Allergies  Allergen Reactions   Codeine Nausea Only   Keflex [Cephalexin] Nausea Only   Penicillins Swelling   Nubain [Nalbuphine] Anxiety    CURRENT MEDICATIONS:  Outpatient Encounter Medications as of 01/28/2021  Medication Sig   atorvastatin (LIPITOR) 20 MG tablet 1 tablet   cyclobenzaprine (FLEXERIL) 5 MG tablet 1 tablet as needed   furosemide (LASIX) 20 MG tablet Take 1 tablet by mouth daily.   gabapentin (NEURONTIN) 300 MG capsule 1 capsule   levothyroxine (SYNTHROID) 50 MCG tablet TAKE 1 TABLET IN THE MORNING ON AN EMPTY STOMACH   lisinopril (ZESTRIL) 10 MG tablet TAKE 1 TABLET EVERY DAY (PLEASE KEEP APPOINTMENT FOR 12/02/2019)   predniSONE (DELTASONE) 20 MG tablet Take 2 tablets (40 mg total) by mouth daily with breakfast for 5 days.   primidone (MYSOLINE) 50 MG tablet Take 1 tablet (50 mg total) by mouth at bedtime.   No facility-administered encounter medications on file as of 01/28/2021.     Objective:    PHYSICAL EXAMINATION:    VITALS:   Vitals:   01/28/21 1456   BP: 132/83  Pulse: 73  SpO2: 96%  Weight: 251 lb 9.6 oz (114.1 kg)  Height: 5\' 9"  (1.753 m)    Gen:  Appears stated age and in NAD. HEENT:  Normocephalic, atraumatic. The mucous membranes are moist.  Neck: There are no carotid bruits noted bilaterally.  Neck is turned to the right and slightly SB to the left.  There is hypertrophy of the left SCM.  Head rotation is limited to the left.   NEUROLOGICAL:   Orientation:  The patient is alert and oriented x 3.   Cranial nerves: There is good facial symmetry. Extraocular muscles are intact and visual fields are full to confrontational testing. Speech is fluent and clear. Hearing is intact to conversational tone. Sensation: Sensation is intact to light touch touch throughout  Coordination:  The patient has no dysdiadichokinesia or dysmetria. Gait and Station: The patient is able to ambulate without difficulty.    MOVEMENT EXAM: Tremor:  There is no postural tremor or intention tremor today.  No significant tremor.   The patient is  able to draw Archimedes spirals without significant difficulty.  There is no tremor at rest.   I have reviewed and interpreted the following labs independently   Chemistry      Component Value Date/Time   NA 132 (L) 10/15/2018 2032   K 4.8 10/15/2018 2032   CL 96 10/15/2018 2032  CO2 20 10/15/2018 2032   BUN 55 (H) 10/15/2018 2032   CREATININE 3.16 (H) 10/15/2018 2032      Component Value Date/Time   CALCIUM 9.9 10/15/2018 2032      No results found for: WBC, HGB, HCT, MCV, PLT No results found for: TSH   Chemistry      Component Value Date/Time   NA 132 (L) 10/15/2018 2032   K 4.8 10/15/2018 2032   CL 96 10/15/2018 2032   CO2 20 10/15/2018 2032   BUN 55 (H) 10/15/2018 2032   CREATININE 3.16 (H) 10/15/2018 2032      Component Value Date/Time   CALCIUM 9.9 10/15/2018 2032        Cc:  Darrow Bussing, MD

## 2021-01-26 NOTE — ED Provider Notes (Signed)
[facility]    CSN: [id number] Arrival date & time: 01/26/21  1549      History   Chief Complaint Chief Complaint  Patient presents with   Foot Pain    HPI Logan Shepherd is a 70 y.o. male.   Patient here today for evaluation of pain and swelling for the last several days. He reports that pain is present to lateral foot. He states this is similar to prior gout flare. He denies any injury. He has not had any numbness or tingling. Weight bearing makes pain worse.   The history is provided by the patient.  Foot Pain Pertinent negatives include no shortness of breath.   Past Medical History:  Diagnosis Date   Hyperlipidemia    Hypertension    Hypothyroidism    Nephrolithiasis    Neuropathy    Renal insufficiency     Patient Active Problem List   Diagnosis Date Noted   COVID-19 virus infection 10/22/2019    Past Surgical History:  Procedure Laterality Date   BACK SURGERY     lumbar   NECK SURGERY     x 4       Home Medications    Prior to Admission medications   Medication Sig Start Date End Date Taking? Authorizing Provider  predniSONE (DELTASONE) 20 MG tablet Take 2 tablets (40 mg total) by mouth daily with breakfast for 5 days. 01/26/21 01/31/21 Yes Tomi Bamberger, PA-C  atorvastatin (LIPITOR) 20 MG tablet 1 tablet    [provider]  cyclobenzaprine (FLEXERIL) 5 MG tablet 1 tablet as needed 04/10/20   [provider]  furosemide (LASIX) 20 MG tablet Take 1 tablet by mouth daily.    [provider]  gabapentin (NEURONTIN) 300 MG capsule 1 capsule 04/17/19   [provider]  levothyroxine (SYNTHROID) 50 MCG tablet TAKE 1 TABLET IN THE MORNING ON AN EMPTY STOMACH    [provider]  lisinopril (ZESTRIL) 10 MG tablet TAKE 1 TABLET EVERY DAY (PLEASE KEEP APPOINTMENT FOR 12/02/2019)    [provider]  primidone (MYSOLINE) 50 MG tablet Take 1 tablet (50 mg total) by mouth at bedtime. 07/24/20    Tat, Octaviano Batty, DO    Family History Family History  Problem Relation Age of Onset   Kidney Stones Father    CAD Father    Heart attack Brother 2    Social History Social History   Tobacco Use   Smoking status: Never   Smokeless tobacco: Never  Vaping Use   Vaping Use: Never used  Substance Use Topics   Alcohol use: Not Currently   Drug use: Never     Allergies   Codeine, Keflex [cephalexin], Penicillins, and Nubain [nalbuphine]   Review of Systems Review of Systems  Constitutional:  Negative for chills and fever.  Eyes:  Negative for discharge and redness.  Respiratory:  Negative for shortness of breath.   Musculoskeletal:  Positive for arthralgias. Negative for joint swelling.  Skin:  Positive for color change and wound.  Neurological:  Negative for numbness.    Physical Exam Triage Vital Signs ED Triage Vitals [01/26/21 1800]  Enc Vitals Group     BP 135/73     Pulse Rate 70     Resp 18     Temp 98.1 F (36.7 C)     Temp Source Oral     SpO2 94 %     Weight      Height  Head Circumference      Peak Flow      Pain Score 7     Pain Loc      Pain Edu?      Excl. in GC?    No data found.  Updated Vital Signs BP 135/73 (BP Location: Left Arm)   Pulse 70   Temp 98.1 F (36.7 C) (Oral)   Resp 18   SpO2 94%   Physical Exam Vitals and nursing note reviewed.  Constitutional:      General: He is not in acute distress.    Appearance: Normal appearance. He is not ill-appearing.  HENT:     Head: Normocephalic and atraumatic.  Eyes:     Conjunctiva/sclera: Conjunctivae normal.  Cardiovascular:     Rate and Rhythm: Normal rate.  Pulmonary:     Effort: Pulmonary effort is normal.  Musculoskeletal:     Comments: Decreased ROM of left toes due to pain, mild swelling noted diffusely to lateral left foot.   Neurological:     Mental Status: He is alert.  Psychiatric:        Mood and Affect: Mood normal.        Behavior: Behavior normal.         Thought Content: Thought content normal.     UC Treatments / Results  Labs (all labs ordered are listed, but only abnormal results are displayed) Labs Reviewed - No data to display  EKG   Radiology No results found.  Procedures Procedures (including critical care time)  Medications Ordered in UC Medications - No data to display  Initial Impression / Assessment and Plan / UC Course  I have reviewed the triage vital signs and the nursing notes.  Pertinent labs & imaging results that were available during my care of the patient were reviewed by me and considered in my medical decision making (see chart for details).    Redness on prescribed for suspected gout.  Recommend follow-up if symptoms fail to improve or worsen.  Final Clinical Impressions(s) / UC Diagnoses   Final diagnoses:  Acute gout of left foot, unspecified cause   Discharge Instructions   None    ED Prescriptions     Medication Sig Dispense Auth. Provider   predniSONE (DELTASONE) 20 MG tablet Take 2 tablets (40 mg total) by mouth daily with breakfast for 5 days. 10 tablet Tomi Bamberger, PA-C      PDMP not reviewed this encounter.   Tomi Bamberger, PA-C 01/26/21 1848

## 2021-01-28 ENCOUNTER — Encounter: Payer: Self-pay | Admitting: Neurology

## 2021-01-28 ENCOUNTER — Other Ambulatory Visit: Payer: Self-pay

## 2021-01-28 ENCOUNTER — Ambulatory Visit: Payer: Medicare HMO | Admitting: Neurology

## 2021-01-28 VITALS — BP 132/83 | HR 73 | Ht 69.0 in | Wt 251.6 lb

## 2021-01-28 DIAGNOSIS — R251 Tremor, unspecified: Secondary | ICD-10-CM

## 2021-01-28 DIAGNOSIS — G243 Spasmodic torticollis: Secondary | ICD-10-CM | POA: Diagnosis not present

## 2021-01-28 NOTE — Patient Instructions (Addendum)
Merry Christmas!  The physicians and staff at [facility] are committed to providing excellent care. You may receive a survey requesting feedback about your experience at our office. We strive to receive "very good" responses to the survey questions. If you feel that your experience would prevent you from giving the office a "very good " response, please contact our office to try to remedy the situation. We may be reached at [phone]. Thank you for taking the time out of your busy day to complete the survey.  

## 2021-01-29 ENCOUNTER — Other Ambulatory Visit: Payer: Self-pay | Admitting: Neurology

## 2021-01-29 DIAGNOSIS — R251 Tremor, unspecified: Secondary | ICD-10-CM

## 2021-01-29 DIAGNOSIS — G243 Spasmodic torticollis: Secondary | ICD-10-CM

## 2021-02-01 ENCOUNTER — Other Ambulatory Visit: Payer: Self-pay

## 2021-02-12 ENCOUNTER — Other Ambulatory Visit: Payer: Self-pay

## 2021-02-12 ENCOUNTER — Ambulatory Visit
Admission: EM | Admit: 2021-02-12 | Discharge: 2021-02-12 | Disposition: A | Discharge status: Home / Self Care | Payer: Medicare HMO | Attending: Physician Assistant | Admitting: Physician Assistant

## 2021-02-12 DIAGNOSIS — M109 Gout, unspecified: Secondary | ICD-10-CM | POA: Diagnosis not present

## 2021-02-12 MED ORDER — COLCHICINE 0.6 MG PO TABS: 0.6000 mg | ORAL_TABLET | Freq: Every day | ORAL | 0 refills | Status: DC

## 2021-02-12 NOTE — ED Triage Notes (Signed)
Patient presents to Urgent Care with complaints of gout flare-up left foot. He states he was here 12/06 for gout and was prescribed steroids that helped. Treating pain with tylenol.

## 2021-02-12 NOTE — ED Provider Notes (Signed)
[facility]    CSN: [id number] Arrival date & time: 02/12/21  1023      History   Chief Complaint No chief complaint on file.   HPI Logan Shepherd is a 70 y.o. male.   Patient here today for evaluation of possible gout flareup of his left foot.  He states that he was treated for same of couple weeks ago and states that symptoms resolved completely.  Yesterday pain returned to the same area of his left lateral foot.  He has been using Tylenol without significant relief  The history is provided by the patient.   Past Medical History:  Diagnosis Date   Hyperlipidemia    Hypertension    Hypothyroidism    Nephrolithiasis    Neuropathy    Renal insufficiency     Patient Active Problem List   Diagnosis Date Noted   COVID-19 virus infection 10/22/2019    Past Surgical History:  Procedure Laterality Date   BACK SURGERY     lumbar   NECK SURGERY     x 4       Home Medications    Prior to Admission medications   Medication Sig Start Date End Date Taking? Authorizing Provider  colchicine 0.6 MG tablet Take 1 tablet (0.6 mg total) by mouth daily for 7 days. 02/12/21 02/19/21 Yes Tomi Bamberger, PA-C  atorvastatin (LIPITOR) 20 MG tablet 1 tablet    [provider]  cyclobenzaprine (FLEXERIL) 5 MG tablet 1 tablet as needed 04/10/20   [provider]  furosemide (LASIX) 20 MG tablet Take 1 tablet by mouth daily.    [provider]  gabapentin (NEURONTIN) 300 MG capsule 1 capsule 04/17/19   [provider]  levothyroxine (SYNTHROID) 50 MCG tablet TAKE 1 TABLET IN THE MORNING ON AN EMPTY STOMACH    [provider]  lisinopril (ZESTRIL) 10 MG tablet TAKE 1 TABLET EVERY DAY (PLEASE KEEP APPOINTMENT FOR 12/02/2019)    [provider]  primidone (MYSOLINE) 50 MG tablet TAKE 1 TABLET AT BEDTIME 02/01/21   Tat, Octaviano Batty, DO    Family History Family History  Problem Relation Age of Onset   Kidney Stones  Father    CAD Father    Heart attack Brother 66    Social History Social History   Tobacco Use   Smoking status: Never   Smokeless tobacco: Never  Vaping Use   Vaping Use: Never used  Substance Use Topics   Alcohol use: Not Currently   Drug use: Never     Allergies   Codeine, Keflex [cephalexin], Penicillins, and Nubain [nalbuphine]   Review of Systems Review of Systems  Constitutional:  Negative for chills and fever.  Eyes:  Negative for discharge and redness.  Respiratory:  Negative for shortness of breath.   Musculoskeletal:  Positive for arthralgias.  Skin:  Positive for color change and wound.  Neurological:  Negative for numbness.    Physical Exam Triage Vital Signs ED Triage Vitals  Enc Vitals Group     BP 02/12/21 1148 138/72     Pulse Rate 02/12/21 1148 85     Resp 02/12/21 1148 16     Temp 02/12/21 1148 98.8 F (37.1 C)     Temp src --      SpO2 02/12/21 1148 96 %     Weight --      Height --      Head Circumference --      Peak Flow --  Pain Score 02/12/21 1147 7     Pain Loc --      Pain Edu? --      Excl. in GC? --    No data found.  Updated Vital Signs BP 138/72    Pulse 85    Temp 98.8 F (37.1 C)    Resp 16    SpO2 96%       Physical Exam Vitals and nursing note reviewed.  Constitutional:      General: He is not in acute distress.    Appearance: Normal appearance. He is not ill-appearing.  HENT:     Head: Normocephalic and atraumatic.  Eyes:     Conjunctiva/sclera: Conjunctivae normal.  Cardiovascular:     Rate and Rhythm: Normal rate.  Pulmonary:     Effort: Pulmonary effort is normal.  Neurological:     Mental Status: He is alert.     Comments: Sensation intact to left toes  Psychiatric:        Mood and Affect: Mood normal.        Behavior: Behavior normal.        Thought Content: Thought content normal.     UC Treatments / Results  Labs (all labs ordered are listed, but only abnormal results are  displayed) Labs Reviewed - No data to display  EKG   Radiology No results found.  Procedures Procedures (including critical care time)  Medications Ordered in UC Medications - No data to display  Initial Impression / Assessment and Plan / UC Course  I have reviewed the triage vital signs and the nursing notes.  Pertinent labs & imaging results that were available during my care of the patient were reviewed by me and considered in my medical decision making (see chart for details).  Colchicine prescribed for suspected gout.  Discussed that I was avoiding further steroid treatment given most recent steroid burst.  Recommended follow-up if colchicine is not effective.   Final Clinical Impressions(s) / UC Diagnoses   Final diagnoses:  Acute gout of left foot, unspecified cause   Discharge Instructions   None    ED Prescriptions     Medication Sig Dispense Auth. Provider   colchicine 0.6 MG tablet Take 1 tablet (0.6 mg total) by mouth daily for 7 days. 7 tablet Tomi Bamberger, PA-C      PDMP not reviewed this encounter.   Tomi Bamberger, PA-C 02/12/21 1341

## 2021-03-12 DIAGNOSIS — E119 Type 2 diabetes mellitus without complications: Secondary | ICD-10-CM | POA: Diagnosis not present

## 2021-03-12 DIAGNOSIS — N1832 Chronic kidney disease, stage 3b: Secondary | ICD-10-CM | POA: Diagnosis not present

## 2021-03-12 DIAGNOSIS — E1169 Type 2 diabetes mellitus with other specified complication: Secondary | ICD-10-CM | POA: Diagnosis not present

## 2021-03-12 DIAGNOSIS — M109 Gout, unspecified: Secondary | ICD-10-CM | POA: Diagnosis not present

## 2021-03-12 DIAGNOSIS — E7800 Pure hypercholesterolemia, unspecified: Secondary | ICD-10-CM | POA: Diagnosis not present

## 2021-04-09 ENCOUNTER — Telehealth: Payer: Self-pay | Admitting: Neurology

## 2021-04-09 ENCOUNTER — Other Ambulatory Visit: Payer: Self-pay

## 2021-04-09 DIAGNOSIS — R251 Tremor, unspecified: Secondary | ICD-10-CM

## 2021-04-09 DIAGNOSIS — G243 Spasmodic torticollis: Secondary | ICD-10-CM

## 2021-04-09 MED ORDER — PRIMIDONE 50 MG PO TABS: 50.0000 mg | ORAL_TABLET | Freq: Every day | ORAL | 0 refills | Status: DC

## 2021-04-09 NOTE — Telephone Encounter (Signed)
1. Which medications need refilled? (List name and dosage, if known) primidone  2. Which pharmacy/location is medication to be sent to? (include street and city if [facility]) [facility] - new insurance this year. Having to use mail order. They are telling her they need a brand new prescription

## 2021-04-12 ENCOUNTER — Other Ambulatory Visit: Payer: Self-pay

## 2021-04-12 DIAGNOSIS — G243 Spasmodic torticollis: Secondary | ICD-10-CM

## 2021-04-12 DIAGNOSIS — R251 Tremor, unspecified: Secondary | ICD-10-CM

## 2021-04-12 MED ORDER — PRIMIDONE 50 MG PO TABS: 50.0000 mg | ORAL_TABLET | Freq: Every day | ORAL | 0 refills | Status: DC

## 2021-04-12 NOTE — Telephone Encounter (Signed)
Called patients wife and left voicemail message that I have sent the primidone prescription in to [facility] for the patient and to call office back with any other concerns

## 2021-04-13 DIAGNOSIS — M109 Gout, unspecified: Secondary | ICD-10-CM | POA: Diagnosis not present

## 2021-04-13 DIAGNOSIS — Z79899 Other long term (current) drug therapy: Secondary | ICD-10-CM | POA: Diagnosis not present

## 2021-05-31 ENCOUNTER — Ambulatory Visit (HOSPITAL_COMMUNITY)
Admission: EM | Admit: 2021-05-31 | Discharge: 2021-05-31 | Disposition: A | Discharge status: Home / Self Care | Payer: Medicare (Managed Care) | Attending: Emergency Medicine | Admitting: Emergency Medicine

## 2021-05-31 ENCOUNTER — Ambulatory Visit
Admission: EM | Admit: 2021-05-31 | Discharge: 2021-05-31 | Disposition: A | Discharge status: Home / Self Care | Payer: Medicare (Managed Care)

## 2021-05-31 ENCOUNTER — Encounter (HOSPITAL_COMMUNITY): Payer: Self-pay

## 2021-05-31 DIAGNOSIS — R062 Wheezing: Secondary | ICD-10-CM

## 2021-05-31 DIAGNOSIS — J0110 Acute frontal sinusitis, unspecified: Secondary | ICD-10-CM

## 2021-05-31 MED ORDER — BENZONATATE 100 MG PO CAPS: 100.0000 mg | ORAL_CAPSULE | Freq: Three times a day (TID) | ORAL | 0 refills | Status: DC

## 2021-05-31 MED ORDER — DOXYCYCLINE HYCLATE 100 MG PO CAPS: 100.0000 mg | ORAL_CAPSULE | Freq: Two times a day (BID) | ORAL | 0 refills | Status: AC

## 2021-05-31 MED ORDER — ALBUTEROL SULFATE HFA 108 (90 BASE) MCG/ACT IN AERS: 2.0000 | INHALATION_SPRAY | RESPIRATORY_TRACT | 0 refills | Status: AC | PRN

## 2021-05-31 NOTE — ED Provider Notes (Signed)
?[facility] ? ? ? ?CSN: [id number] ?Arrival date & time: 05/31/21  1743 ? ? ?  ? ?History   ?Chief Complaint ?Chief Complaint  ?Patient presents with  ? URI  ? ? ?HPI ?Logan Shepherd PaymentCurtis Shepherd is a 71 y.o. male.  ? ?Patient presents with headache, nasal congestion, sinus pressure, post-nasal drainage, and productive cough for the past week or so. He reports intermittent fever. He states he watches his grandkids who were recently sick with colds and ear infections. He and his wife subsequently developed colds as well. The patient has tried some OTC medicine with minimal improvement. He reports some intermittent wheezing but denies history of asthma.  ? ?The history is provided by the patient.  ?URI ?Presenting symptoms: congestion, cough, fatigue and fever   ?Presenting symptoms: no ear pain and no sore throat   ?Associated symptoms: headaches and wheezing   ?Associated symptoms: no myalgias   ? ?Past Medical History:  ?Diagnosis Date  ? Hyperlipidemia   ? Hypertension   ? Hypothyroidism   ? Nephrolithiasis   ? Neuropathy   ? Renal insufficiency   ? ? ?Patient Active Problem List  ? Diagnosis Date Noted  ? COVID-19 virus infection 10/22/2019  ? ? ?Past Surgical History:  ?Procedure Laterality Date  ? BACK SURGERY    ? lumbar  ? NECK SURGERY    ? x 4  ? ? ? ? ? ?Home Medications   ? ?Prior to Admission medications   ?Medication Sig Start Date End Date Taking? Authorizing Provider  ?albuterol (VENTOLIN HFA) 108 (90 Base) MCG/ACT inhaler Inhale 2 puffs into the lungs every 4 (four) hours as needed for wheezing or shortness of breath. 05/31/21  Yes Izzac Rockett L, PA  ?benzonatate (TESSALON) 100 MG capsule Take 1 capsule (100 mg total) by mouth every 8 (eight) hours. 05/31/21  Yes Shandell Giovanni L, PA  ?doxycycline (VIBRAMYCIN) 100 MG capsule Take 1 capsule (100 mg total) by mouth 2 (two) times daily for 7 days. 05/31/21 06/07/21 Yes Sultan Pargas L, PA  ?atorvastatin (LIPITOR) 20 MG tablet 1 tablet    [provider]  ?colchicine 0.6 MG tablet Take 1 tablet (0.6 mg total) by mouth daily for 7 days. 02/12/21 02/19/21  Tomi BambergerMyers, Rebecca F, PA-C  ?cyclobenzaprine (FLEXERIL) 5 MG tablet 1 tablet as needed 04/10/20   [provider]  ?furosemide (LASIX) 20 MG tablet Take 1 tablet by mouth daily.    [provider]  ?gabapentin (NEURONTIN) 300 MG capsule 1 capsule 04/17/19   [provider]  ?levothyroxine (SYNTHROID) 50 MCG tablet TAKE 1 TABLET IN THE MORNING ON AN EMPTY STOMACH    [provider]  ?lisinopril (ZESTRIL) 10 MG tablet TAKE 1 TABLET EVERY DAY (PLEASE KEEP APPOINTMENT FOR 12/02/2019)    [provider]  ?primidone (MYSOLINE) 50 MG tablet Take 1 tablet (50 mg total) by mouth daily. 04/12/21   Vladimir Fasterat, Rebecca S, DO  ? ? ?Family History ?Family History  ?Problem Relation Age of Onset  ? Kidney Stones Father   ? CAD Father   ? Heart attack Brother 50  ? ? ?Social History ?Social History  ? ?Tobacco Use  ? Smoking status: Never  ? Smokeless tobacco: Never  ?Vaping Use  ? Vaping Use: Never used  ?Substance Use Topics  ? Alcohol use: Not Currently  ? Drug use: Never  ? ? ? ?Allergies   ?Codeine, Keflex [cephalexin], Penicillins, and Nubain [nalbuphine] ? ? ?Review of Systems ?Review of  Systems  ?Constitutional:  Positive for fatigue and fever.  ?HENT:  Positive for congestion, postnasal drip and sinus pressure. Negative for ear pain and sore throat.   ?Respiratory:  Positive for cough and wheezing. Negative for chest tightness and shortness of breath.   ?Cardiovascular:  Negative for chest pain.  ?Gastrointestinal:  Negative for nausea and vomiting.  ?Musculoskeletal:  Negative for myalgias.  ?Skin:  Negative for rash.  ?Neurological:  Positive for headaches. Negative for dizziness.  ? ? ?Physical Exam ?Triage Vital Signs ?ED Triage Vitals  ?Enc Vitals Group  ?   BP 05/31/21 1846 124/68  ?   Pulse Rate 05/31/21 1845 80  ?   Resp 05/31/21 1845 20  ?   Temp 05/31/21 1845 98.2 ?F (36.8 ?C)  ?    Temp Source 05/31/21 1845 Oral  ?   SpO2 05/31/21 1845 94 %  ?   Weight --   ?   Height --   ?   Head Circumference --   ?   Peak Flow --   ?   Pain Score 05/31/21 1845 0  ?   Pain Loc --   ?   Pain Edu? --   ?   Excl. in [place]? --   ? ?No data found. ? ?Updated Vital Signs ?BP 124/68   Pulse 80   Temp 98.2 ?F (36.8 ?C) (Oral)   Resp 20   SpO2 94%  ? ?Visual Acuity ?Right Eye Distance:   ?Left Eye Distance:   ?Bilateral Distance:   ? ?Right Eye Near:   ?Left Eye Near:    ?Bilateral Near:    ? ?Physical Exam ?Vitals and nursing note reviewed.  ?Constitutional:   ?   General: He is not in acute distress. ?HENT:  ?   Head: Normocephalic.  ?   Nose:  ?   Right Sinus: Maxillary sinus tenderness and frontal sinus tenderness present.  ?   Left Sinus: Maxillary sinus tenderness and frontal sinus tenderness present.  ?Eyes:  ?   Pupils: Pupils are equal, round, and reactive to light.  ?Cardiovascular:  ?   Rate and Rhythm: Normal rate and regular rhythm.  ?   Heart sounds: Normal heart sounds.  ?Pulmonary:  ?   Effort: Pulmonary effort is normal.  ?   Breath sounds: Wheezing (mild) and rhonchi (clear with coughing) present. No rales.  ?Lymphadenopathy:  ?   Cervical: No cervical adenopathy.  ?Skin: ?   Findings: No rash.  ?Neurological:  ?   Mental Status: He is alert.  ?Psychiatric:     ?   Mood and Affect: Mood normal.  ? ? ? ?UC Treatments / Results  ?Labs ?(all labs ordered are listed, but only abnormal results are displayed) ?Labs Reviewed - No data to display ? ?EKG ? ? ?Radiology ?No results found. ? ?Procedures ?Procedures (including critical care time) ? ?Medications Ordered in UC ?Medications - No data to display ? ?Initial Impression / Assessment and Plan / UC Course  ?I have reviewed the triage vital signs and the nursing notes. ? ?Pertinent labs & imaging results that were available during my care of the patient were reviewed by me and considered in my medical decision making (see chart for details). ? ?   ? ?S/s consistent with sinusitis - duration of about a week with reworsening and fever. Empiric doxy due to pcn allergy. Sx tx with Tessalon and inhaler for wheezing. Discussed ER and return precautions.  ? ?E/M: 1 acute  uncomplicated illness, no data, moderate risk due to prescription management ? ?Final Clinical Impressions(s) / UC Diagnoses  ? ?Final diagnoses:  ?Acute non-recurrent frontal sinusitis  ?Wheezing  ? ? ? ?Discharge Instructions   ? ?  ?Take antibiotics as prescribed. Take Tessalon as prescribed as needed for cough and inhaler for wheezing. Rest and keep hydrated. Follow-up with PCP if no improvement after completing antibiotics.  ?Go to the ER if develop difficulty breathing.  ? ? ? ?ED Prescriptions   ? ? Medication Sig Dispense Auth. Provider  ? doxycycline (VIBRAMYCIN) 100 MG capsule Take 1 capsule (100 mg total) by mouth 2 (two) times daily for 7 days. 14 capsule Vallery Sa, Handy Mcloud L, PA  ? benzonatate (TESSALON) 100 MG capsule Take 1 capsule (100 mg total) by mouth every 8 (eight) hours. 21 capsule Vallery Sa, Charyl Minervini L, PA  ? albuterol (VENTOLIN HFA) 108 (90 Base) MCG/ACT inhaler Inhale 2 puffs into the lungs every 4 (four) hours as needed for wheezing or shortness of breath. 1 each Estanislado Pandy, PA  ? ?  ? ?PDMP not reviewed this encounter. ?  Estanislado Pandy, [place] ?05/31/21 1947 ? ?

## 2021-05-31 NOTE — ED Triage Notes (Signed)
Pt presents with congestion, post nasal drainage, and chills X 5 days ?

## 2021-05-31 NOTE — Discharge Instructions (Addendum)
Take antibiotics as prescribed. Take Tessalon as prescribed as needed for cough and inhaler for wheezing. Rest and keep hydrated. Follow-up with PCP if no improvement after completing antibiotics.  ?Go to the ER if develop difficulty breathing.  ?

## 2021-06-01 ENCOUNTER — Ambulatory Visit: Payer: Self-pay

## 2021-06-18 ENCOUNTER — Ambulatory Visit (INDEPENDENT_AMBULATORY_CARE_PROVIDER_SITE_OTHER): Payer: Medicare (Managed Care)

## 2021-06-18 ENCOUNTER — Encounter (HOSPITAL_COMMUNITY): Payer: Self-pay

## 2021-06-18 ENCOUNTER — Ambulatory Visit (HOSPITAL_COMMUNITY)
Admission: EM | Admit: 2021-06-18 | Discharge: 2021-06-18 | Disposition: A | Discharge status: Home / Self Care | Payer: Medicare (Managed Care) | Attending: Family Medicine | Admitting: Family Medicine

## 2021-06-18 DIAGNOSIS — Z880 Allergy status to penicillin: Secondary | ICD-10-CM | POA: Insufficient documentation

## 2021-06-18 DIAGNOSIS — J168 Pneumonia due to other specified infectious organisms: Secondary | ICD-10-CM | POA: Insufficient documentation

## 2021-06-18 DIAGNOSIS — R519 Headache, unspecified: Secondary | ICD-10-CM | POA: Diagnosis not present

## 2021-06-18 DIAGNOSIS — R059 Cough, unspecified: Secondary | ICD-10-CM | POA: Diagnosis not present

## 2021-06-18 DIAGNOSIS — J9811 Atelectasis: Secondary | ICD-10-CM | POA: Insufficient documentation

## 2021-06-18 DIAGNOSIS — J4521 Mild intermittent asthma with (acute) exacerbation: Secondary | ICD-10-CM | POA: Diagnosis not present

## 2021-06-18 DIAGNOSIS — R0600 Dyspnea, unspecified: Secondary | ICD-10-CM

## 2021-06-18 DIAGNOSIS — Z20822 Contact with and (suspected) exposure to covid-19: Secondary | ICD-10-CM | POA: Diagnosis not present

## 2021-06-18 DIAGNOSIS — J189 Pneumonia, unspecified organism: Secondary | ICD-10-CM

## 2021-06-18 MED ORDER — LEVOFLOXACIN 250 MG PO TABS: ORAL_TABLET | ORAL | 0 refills | Status: DC

## 2021-06-18 MED ORDER — TRIAMCINOLONE ACETONIDE 40 MG/ML IJ SUSP
40.0000 mg | Freq: Once | INTRAMUSCULAR | Status: AC
Start: 2021-06-18 — End: 2021-06-18
  Administered 2021-06-18: 40 mg via INTRAMUSCULAR

## 2021-06-18 MED ORDER — BENZONATATE 100 MG PO CAPS: 100.0000 mg | ORAL_CAPSULE | Freq: Three times a day (TID) | ORAL | 0 refills | Status: DC | PRN

## 2021-06-18 MED ORDER — TRIAMCINOLONE ACETONIDE 40 MG/ML IJ SUSP
INTRAMUSCULAR | Status: AC
  Filled 2021-06-18: qty 1

## 2021-06-18 NOTE — Discharge Instructions (Addendum)
There is a possible early pneumonia on your chest x-ray on the left lower side ? ?Take benzonatate 100 mg--1 capsule 3 times daily as needed for cough ? ?Take Levaquin 250 mg according to the following instructions (this is adjusted for your kidney function)--2 tabs today orally.  Then you will take 1 tablet every 48 hours for 3 more doses total ? ?Get seen on an urgent basis if you are feeling worse ? ? ?You have been swabbed for COVID, and the test will result in the next 24 hours. Our staff will call you if positive. If the test is positive, you should quarantine for 5 days.  ?

## 2021-06-18 NOTE — ED Provider Notes (Addendum)
?[facility] ? ? ? ?CSN: [id number] ?Arrival date & time: 06/18/21  1251 ? ? ?  ? ?History   ?Chief Complaint ?Chief Complaint  ?Patient presents with  ? Facial Pain  ? ? ?HPI ?Logan Shepherd is a 71 y.o. male.  ? ?HPI ?Here for 3-day history of cough, postnasal drainage and throat irritation.  He has had some chills but no fever.  He had diarrhea about 6 times yesterday and 3 times today so far.  No nausea or vomiting.  He has a headache mainly in his frontal and maxillary areas.  He has a good bit of shortness of breath now, though he does not hear any wheezing at this time ? ?He was seen here April 10 and received treatment with doxycycline for possible sinusitis.  His congestion and wheezing all improved.  He had about 1 to 2 days without any symptoms and then began getting sick again 3 days ago ? ?Past Medical History:  ?Diagnosis Date  ? Hyperlipidemia   ? Hypertension   ? Hypothyroidism   ? Nephrolithiasis   ? Neuropathy   ? Renal insufficiency   ? ? ?Patient Active Problem List  ? Diagnosis Date Noted  ? COVID-19 virus infection 10/22/2019  ? ? ?Past Surgical History:  ?Procedure Laterality Date  ? BACK SURGERY    ? lumbar  ? NECK SURGERY    ? x 4  ? ? ? ? ? ?Home Medications   ? ?Prior to Admission medications   ?Medication Sig Start Date End Date Taking? Authorizing Provider  ?benzonatate (TESSALON) 100 MG capsule Take 1 capsule (100 mg total) by mouth 3 (three) times daily as needed for cough. 06/18/21  Yes Barrett Henle, MD  ?levofloxacin (LEVAQUIN) 250 MG tablet 2 tabs orally the first day, then 1 tablet every 48 hours for 3 more doses(dosing for eGFR approx 19) 06/18/21  Yes Onyekachi Gathright, Gwenlyn Perking, MD  ?albuterol (VENTOLIN HFA) 108 (90 Base) MCG/ACT inhaler Inhale 2 puffs into the lungs every 4 (four) hours as needed for wheezing or shortness of breath. 05/31/21   Abner Greenspan, Amy L, PA  ?atorvastatin (LIPITOR) 20 MG tablet 1 tablet    [provider]  ?colchicine 0.6 MG tablet Take 1  tablet (0.6 mg total) by mouth daily for 7 days. 02/12/21 02/19/21  Francene Finders, PA-C  ?cyclobenzaprine (FLEXERIL) 5 MG tablet 1 tablet as needed 04/10/20   [provider]  ?furosemide (LASIX) 20 MG tablet Take 1 tablet by mouth daily.    [provider]  ?gabapentin (NEURONTIN) 300 MG capsule 1 capsule 04/17/19   [provider]  ?levothyroxine (SYNTHROID) 50 MCG tablet TAKE 1 TABLET IN THE MORNING ON AN EMPTY STOMACH    [provider]  ?lisinopril (ZESTRIL) 10 MG tablet TAKE 1 TABLET EVERY DAY (PLEASE KEEP APPOINTMENT FOR 12/02/2019)    [provider]  ?primidone (MYSOLINE) 50 MG tablet Take 1 tablet (50 mg total) by mouth daily. 04/12/21   Ludwig Clarks, DO  ? ? ?Family History ?Family History  ?Problem Relation Age of Onset  ? Kidney Stones Father   ? CAD Father   ? Heart attack Brother 63  ? ? ?Social History ?Social History  ? ?Tobacco Use  ? Smoking status: Never  ? Smokeless tobacco: Never  ?Vaping Use  ? Vaping Use: Never used  ?Substance Use Topics  ? Alcohol use: Not Currently  ? Drug use: Never  ? ? ? ?Allergies   ?  Codeine, Keflex [cephalexin], Penicillins, and Nubain [nalbuphine] ? ? ?Review of Systems ?Review of Systems ? ? ?Physical Exam ?Triage Vital Signs ?ED Triage Vitals [06/18/21 1354]  ?Enc Vitals Group  ?   BP (!) 169/79  ?   Pulse Rate 65  ?   Resp 16  ?   Temp 98.3 ?F (36.8 ?C)  ?   Temp Source Oral  ?   SpO2 96 %  ?   Weight   ?   Height   ?   Head Circumference   ?   Peak Flow   ?   Pain Score   ?   Pain Loc   ?   Pain Edu?   ?   Excl. in [place]?   ? ?No data found. ? ?Updated Vital Signs ?BP (!) 169/79 (BP Location: Left Arm)   Pulse 65   Temp 98.3 ?F (36.8 ?C) (Oral)   Resp 16   SpO2 96%  ? ?Visual Acuity ?Right Eye Distance:   ?Left Eye Distance:   ?Bilateral Distance:   ? ?Right Eye Near:   ?Left Eye Near:    ?Bilateral Near:    ? ?Physical Exam ?Vitals reviewed.  ?Constitutional:   ?   General: He is not in acute distress. ?    Appearance: He is not toxic-appearing.  ?HENT:  ?   Right Ear: Tympanic membrane and ear canal normal.  ?   Left Ear: Tympanic membrane and ear canal normal.  ?   Nose: Nose normal.  ?   Mouth/Throat:  ?   Mouth: Mucous membranes are moist.  ?   Pharynx: No posterior oropharyngeal erythema.  ?   Comments: Clear mucus is draining in the oropharynx ?Eyes:  ?   Extraocular Movements: Extraocular movements intact.  ?   Conjunctiva/sclera: Conjunctivae normal.  ?   Pupils: Pupils are equal, round, and reactive to light.  ?Cardiovascular:  ?   Rate and Rhythm: Normal rate and regular rhythm.  ?   Heart sounds: No murmur heard. ?Pulmonary:  ?   Effort: Pulmonary effort is normal.  ?   Breath sounds: Normal breath sounds.  ?Musculoskeletal:  ?   Cervical back: Neck supple.  ?Lymphadenopathy:  ?   Cervical: No cervical adenopathy.  ?Skin: ?   Capillary Refill: Capillary refill takes less than 2 seconds.  ?   Coloration: Skin is not jaundiced or pale.  ?Neurological:  ?   General: No focal deficit present.  ?   Mental Status: He is alert and oriented to person, place, and time.  ?   Comments: Fine tremor of his hands and head is noted  ?Psychiatric:     ?   Behavior: Behavior normal.  ? ? ? ?UC Treatments / Results  ?Labs ?(all labs ordered are listed, but only abnormal results are displayed) ?Labs Reviewed  ?SARS CORONAVIRUS 2 (TAT 6-24 HRS)  ? ? ?EKG ? ? ?Radiology ?DG Chest 2 View ? ?Result Date: 06/18/2021 ?CLINICAL DATA:  Dyspnea.  Cough.  Headache.  Symptoms for 3 days. EXAM: CHEST - 2 VIEW COMPARISON:  None. FINDINGS: The cardiomediastinal contours are normal. Subsegmental opacities at the left lung base typical of atelectasis. Pulmonary vasculature is normal. No consolidation, pleural effusion, or pneumothorax. Spinal stimulator in place. No acute osseous abnormalities are seen. IMPRESSION: Subsegmental atelectasis at the left lung base. Electronically Signed   By: Keith Rake M.D.   On: 06/18/2021 14:48    ? ?Procedures ?Procedures (including critical care time) ? ?  Medications Ordered in UC ?Medications  ?triamcinolone acetonide (KENALOG-40) injection 40 mg (has no administration in time range)  ? ? ?Initial Impression / Assessment and Plan / UC Course  ?I have reviewed the triage vital signs and the nursing notes. ? ?Pertinent labs & imaging results that were available during my care of the patient were reviewed by me and considered in my medical decision making (see chart for details). ? ?  ? ?Chest x-ray shows some possible atelectasis in the left lower lobe.  We will treat for possible developing pneumonia.  We will swab for COVID. And if he is positive he is at high risk for severe disease, I would do him a prescription of molnupiravir.  His creatinine is 3.16 on last lab done in epic.  eGFR when last time was about 20. Will do renal adjusted dose of his levaquin (is allergic to penicillin) ? ? ?Final Clinical Impressions(s) / UC Diagnoses  ? ?Final diagnoses:  ?Pneumonia of left lower lobe due to infectious organism  ?Mild intermittent asthma with acute exacerbation  ? ? ? ?Discharge Instructions   ? ?  ?There is a possible early pneumonia on your chest x-ray on the left lower side ? ?Take benzonatate 100 mg--1 capsule 3 times daily as needed for cough ? ?Take Levaquin 250 mg according to the following instructions (this is adjusted for your kidney function)--2 tabs today orally.  Then you will take 1 tablet every 48 hours for 3 more doses total ? ?Get seen on an urgent basis if you are feeling worse ? ? ?You have been swabbed for COVID, and the test will result in the next 24 hours. Our staff will call you if positive. If the test is positive, you should quarantine for 5 days.  ? ? ? ? ?ED Prescriptions   ? ? Medication Sig Dispense Auth. Provider  ? levofloxacin (LEVAQUIN) 250 MG tablet 2 tabs orally the first day, then 1 tablet every 48 hours for 3 more doses(dosing for eGFR approx 19) 5 tablet Mariafernanda Hendricksen,  Gwenlyn Perking, MD  ? benzonatate (TESSALON) 100 MG capsule Take 1 capsule (100 mg total) by mouth 3 (three) times daily as needed for cough. 21 capsule Barrett Henle, MD  ? ?  ? ?PDMP not reviewed this encounter. ?  ?[place]

## 2021-06-18 NOTE — ED Triage Notes (Signed)
Pt presents to office for sinus pressure and congestion x 2-3 days. ?

## 2021-06-19 LAB — SARS CORONAVIRUS 2 (TAT 6-24 HRS): SARS Coronavirus 2: NEGATIVE

## 2021-07-20 ENCOUNTER — Ambulatory Visit (INDEPENDENT_AMBULATORY_CARE_PROVIDER_SITE_OTHER): Payer: Medicare (Managed Care)

## 2021-07-20 ENCOUNTER — Ambulatory Visit (HOSPITAL_COMMUNITY)
Admission: RE | Admit: 2021-07-20 | Discharge: 2021-07-20 | Disposition: A | Discharge status: Home / Self Care | Payer: Medicare (Managed Care) | Source: Ambulatory Visit | Attending: Family Medicine | Admitting: Family Medicine

## 2021-07-20 ENCOUNTER — Encounter (HOSPITAL_COMMUNITY): Payer: Self-pay

## 2021-07-20 VITALS — BP 131/63 | HR 80 | Temp 98.7°F | Resp 18

## 2021-07-20 DIAGNOSIS — J019 Acute sinusitis, unspecified: Secondary | ICD-10-CM | POA: Diagnosis not present

## 2021-07-20 DIAGNOSIS — R059 Cough, unspecified: Secondary | ICD-10-CM | POA: Diagnosis not present

## 2021-07-20 DIAGNOSIS — R051 Acute cough: Secondary | ICD-10-CM

## 2021-07-20 MED ORDER — PREDNISONE 10 MG (21) PO TBPK: ORAL_TABLET | Freq: Every day | ORAL | 0 refills | Status: DC

## 2021-07-20 MED ORDER — DOXYCYCLINE HYCLATE 100 MG PO CAPS: 100.0000 mg | ORAL_CAPSULE | Freq: Two times a day (BID) | ORAL | 0 refills | Status: DC

## 2021-07-20 MED ORDER — BENZONATATE 100 MG PO CAPS: 100.0000 mg | ORAL_CAPSULE | Freq: Three times a day (TID) | ORAL | 0 refills | Status: DC

## 2021-07-20 NOTE — ED Provider Notes (Signed)
[facility]    CSN: [id number] Arrival date & time: 07/20/21  1741      History   Chief Complaint Chief Complaint  Patient presents with   appt 6   Cough   Sore Throat    HPI Logan Shepherd is a 71 y.o. male.   PT complains of cough, congestion, and sinus pain and pressure that started three weeks ago.  He was seen for similar sx about one month ago.  Sx resolved.  Reports he watches his grandchildren and one of his grandchildren had similar sx.  He denies fever, chills, body aches, n/v, shortness of breath, wheezing.  He is having trouble sleeping due to cough.  He has been using albuterol inhaler with minimal relief. Nothing seems to make the sx better.    Past Medical History:  Diagnosis Date   Hyperlipidemia    Hypertension    Hypothyroidism    Nephrolithiasis    Neuropathy    Renal insufficiency     Patient Active Problem List   Diagnosis Date Noted   COVID-19 virus infection 10/22/2019    Past Surgical History:  Procedure Laterality Date   BACK SURGERY     lumbar   NECK SURGERY     x 4       Home Medications    Prior to Admission medications   Medication Sig Start Date End Date Taking? Authorizing Provider  benzonatate (TESSALON) 100 MG capsule Take 1 capsule (100 mg total) by mouth every 8 (eight) hours. 07/20/21  Yes Ward, Lenise Arena, PA-C  doxycycline (VIBRAMYCIN) 100 MG capsule Take 1 capsule (100 mg total) by mouth 2 (two) times daily. 07/20/21  Yes Ward, Lenise Arena, PA-C  predniSONE (STERAPRED UNI-PAK 21 TAB) 10 MG (21) TBPK tablet Take by mouth daily. Take 6 tabs by mouth daily  for 2 days, then 5 tabs for 2 days, then 4 tabs for 2 days, then 3 tabs for 2 days, 2 tabs for 2 days, then 1 tab by mouth daily for 2 days 07/20/21  Yes Ward, Lenise Arena, PA-C  albuterol (VENTOLIN HFA) 108 (90 Base) MCG/ACT inhaler Inhale 2 puffs into the lungs every 4 (four) hours as needed for wheezing or shortness of breath. 05/31/21   Abner Greenspan, Amy L, PA   atorvastatin (LIPITOR) 20 MG tablet 1 tablet    [provider]  colchicine 0.6 MG tablet Take 1 tablet (0.6 mg total) by mouth daily for 7 days. 02/12/21 02/19/21  Francene Finders, PA-C  cyclobenzaprine (FLEXERIL) 5 MG tablet 1 tablet as needed 04/10/20   [provider]  furosemide (LASIX) 20 MG tablet Take 1 tablet by mouth daily.    [provider]  gabapentin (NEURONTIN) 300 MG capsule 1 capsule 04/17/19   [provider]  levofloxacin (LEVAQUIN) 250 MG tablet 2 tabs orally the first day, then 1 tablet every 48 hours for 3 more doses(dosing for eGFR approx 19) 06/18/21   Banister, Gwenlyn Perking, MD  levothyroxine (SYNTHROID) 50 MCG tablet TAKE 1 TABLET IN THE MORNING ON AN EMPTY STOMACH    [provider]  lisinopril (ZESTRIL) 10 MG tablet TAKE 1 TABLET EVERY DAY (PLEASE KEEP APPOINTMENT FOR 12/02/2019)    [provider]  primidone (MYSOLINE) 50 MG tablet Take 1 tablet (50 mg total) by mouth daily. 04/12/21   TatEustace Quail, DO    Family History Family History  Problem Relation Age of Onset   Kidney Stones Father    CAD  Father    Heart attack Brother 68    Social History Social History   Tobacco Use   Smoking status: Never   Smokeless tobacco: Never  Vaping Use   Vaping Use: Never used  Substance Use Topics   Alcohol use: Not Currently   Drug use: Never     Allergies   Codeine, Keflex [cephalexin], Penicillins, and Nubain [nalbuphine]   Review of Systems Review of Systems  Constitutional:  Negative for chills and fever.  HENT:  Positive for congestion, sinus pressure and sinus pain. Negative for ear pain and sore throat.   Eyes:  Negative for pain and visual disturbance.  Respiratory:  Positive for cough. Negative for shortness of breath.   Cardiovascular:  Negative for chest pain and palpitations.  Gastrointestinal:  Negative for abdominal pain and vomiting.  Genitourinary:  Negative for dysuria and hematuria.   Musculoskeletal:  Negative for arthralgias and back pain.  Skin:  Negative for color change and rash.  Neurological:  Negative for seizures and syncope.  All other systems reviewed and are negative.   Physical Exam Triage Vital Signs ED Triage Vitals  Enc Vitals Group     BP 07/20/21 1841 131/63     Pulse Rate 07/20/21 1841 80     Resp 07/20/21 1841 18     Temp 07/20/21 1841 98.7 F (37.1 C)     Temp Source 07/20/21 1841 Oral     SpO2 07/20/21 1841 100 %     Weight --      Height --      Head Circumference --      Peak Flow --      Pain Score 07/20/21 1839 6     Pain Loc --      Pain Edu? --      Excl. in [place]? --    No data found.  Updated Vital Signs BP 131/63 (BP Location: Right Arm)   Pulse 80   Temp 98.7 F (37.1 C) (Oral)   Resp 18   SpO2 100%   Visual Acuity Right Eye Distance:   Left Eye Distance:   Bilateral Distance:    Right Eye Near:   Left Eye Near:    Bilateral Near:     Physical Exam Vitals and nursing note reviewed.  Constitutional:      General: He is not in acute distress.    Appearance: He is well-developed.  HENT:     Head: Normocephalic and atraumatic.  Eyes:     Conjunctiva/sclera: Conjunctivae normal.  Cardiovascular:     Rate and Rhythm: Normal rate and regular rhythm.     Heart sounds: No murmur heard. Pulmonary:     Effort: Pulmonary effort is normal. No respiratory distress.     Breath sounds: Normal breath sounds.  Abdominal:     Palpations: Abdomen is soft.     Tenderness: There is no abdominal tenderness.  Musculoskeletal:        General: No swelling.     Cervical back: Neck supple.  Skin:    General: Skin is warm and dry.     Capillary Refill: Capillary refill takes less than 2 seconds.  Neurological:     Mental Status: He is alert.  Psychiatric:        Mood and Affect: Mood normal.     UC Treatments / Results  Labs (all labs ordered are listed, but only abnormal results are displayed) Labs Reviewed - No  data to display  EKG  Radiology DG Chest 2 View  Result Date: 07/20/2021 CLINICAL DATA:  Cough 3 weeks EXAM: CHEST - 2 VIEW COMPARISON:  06/18/2021 FINDINGS: The heart size and mediastinal contours are within normal limits. Both lungs are clear. The visualized skeletal structures are unremarkable. Spinal cord stimulator thoracic spine unchanged in position. IMPRESSION: No active cardiopulmonary disease. Electronically Signed   By: Franchot Gallo M.D.   On: 07/20/2021 19:04    Procedures Procedures (including critical care time)  Medications Ordered in UC Medications - No data to display  Initial Impression / Assessment and Plan / UC Course  I have reviewed the triage vital signs and the nursing notes.  Pertinent labs & imaging results that were available during my care of the patient were reviewed by me and considered in my medical decision making (see chart for details).     Acute sinusitis.  Will prescribe antibiotic, tessalon pearls, and prednisone dosepak  Chest xray negative.  Stable for discharge.  If no improvement follow up with PCP.  Final Clinical Impressions(s) / UC Diagnoses   Final diagnoses:  Acute sinusitis, recurrence not specified, unspecified location  Acute cough     Discharge Instructions      Take antibiotic as prescribed Take prednisone as prescribed Can take tessalon as needed for acough Drink plenty of fluids Follow up with primary care if symptoms persists.    ED Prescriptions     Medication Sig Dispense Auth. Provider   benzonatate (TESSALON) 100 MG capsule Take 1 capsule (100 mg total) by mouth every 8 (eight) hours. 21 capsule Ward, Janett Billow Z, PA-C   predniSONE (STERAPRED UNI-PAK 21 TAB) 10 MG (21) TBPK tablet Take by mouth daily. Take 6 tabs by mouth daily  for 2 days, then 5 tabs for 2 days, then 4 tabs for 2 days, then 3 tabs for 2 days, 2 tabs for 2 days, then 1 tab by mouth daily for 2 days 42 tablet Ward, Lenise Arena, PA-C   doxycycline  (VIBRAMYCIN) 100 MG capsule Take 1 capsule (100 mg total) by mouth 2 (two) times daily. 20 capsule Ward, Lenise Arena, PA-C      PDMP not reviewed this encounter.   Ward, Lenise Arena, PA-C 07/20/21 1940

## 2021-07-20 NOTE — ED Triage Notes (Signed)
Pt reports 3 week of having sore throat and dry cough. Past several days congestion started up. Using cough drops without relief.

## 2021-07-20 NOTE — Discharge Instructions (Signed)
Take antibiotic as prescribed Take prednisone as prescribed Can take tessalon as needed for acough Drink plenty of fluids Follow up with primary care if symptoms persists.

## 2021-07-21 ENCOUNTER — Other Ambulatory Visit: Payer: Self-pay | Admitting: Neurology

## 2021-07-21 DIAGNOSIS — R251 Tremor, unspecified: Secondary | ICD-10-CM

## 2021-07-21 DIAGNOSIS — G243 Spasmodic torticollis: Secondary | ICD-10-CM

## 2021-09-30 ENCOUNTER — Encounter (HOSPITAL_COMMUNITY): Payer: Self-pay

## 2021-09-30 ENCOUNTER — Ambulatory Visit: Payer: Self-pay

## 2021-09-30 ENCOUNTER — Ambulatory Visit (HOSPITAL_COMMUNITY)
Admission: EM | Admit: 2021-09-30 | Discharge: 2021-09-30 | Disposition: A | Discharge status: Home / Self Care | Payer: Medicare (Managed Care) | Attending: Internal Medicine | Admitting: Internal Medicine

## 2021-09-30 DIAGNOSIS — M25532 Pain in left wrist: Secondary | ICD-10-CM

## 2021-09-30 NOTE — Discharge Instructions (Addendum)
Please call hand surgeon's office and schedule an appointment to be evaluated further.

## 2021-09-30 NOTE — ED Provider Notes (Addendum)
[facility]    CSN: [id number] Arrival date & time: 09/30/21  1526      History   Chief Complaint Chief Complaint  Patient presents with   Fall   Hand Pain    HPI Logan Shepherd is a 71 y.o. male comes to the urgent care with right wrist pain and weakness in the left hand.  Patient has fallen twice over the past 3 months.  Following the fall patient noticed the swelling on the palmar aspect of the left hand.  He anticipated that the swelling will get better but he got concerned because of weakness of the left hand.  Left hand grip strength has decreased.  He has some numbness and tingling in the left fingers as well.   HPI      Past Medical History:  Diagnosis Date   Hyperlipidemia    Hypertension    Hypothyroidism    Nephrolithiasis    Neuropathy    Renal insufficiency     Patient Active Problem List   Diagnosis Date Noted   COVID-19 virus infection 10/22/2019    Past Surgical History:  Procedure Laterality Date   BACK SURGERY     lumbar   NECK SURGERY     x 4       Home Medications    Prior to Admission medications   Medication Sig Start Date End Date Taking? Authorizing Provider  albuterol (VENTOLIN HFA) 108 (90 Base) MCG/ACT inhaler Inhale 2 puffs into the lungs every 4 (four) hours as needed for wheezing or shortness of breath. 05/31/21   Vallery Sa, Amy L, PA  atorvastatin (LIPITOR) 20 MG tablet 1 tablet    [provider]  colchicine 0.6 MG tablet Take 1 tablet (0.6 mg total) by mouth daily for 7 days. 02/12/21 02/19/21  Tomi Bamberger, PA-C  cyclobenzaprine (FLEXERIL) 5 MG tablet 1 tablet as needed 04/10/20   [provider]  furosemide (LASIX) 20 MG tablet Take 1 tablet by mouth daily.    [provider]  gabapentin (NEURONTIN) 300 MG capsule 1 capsule 04/17/19   [provider]  levothyroxine (SYNTHROID) 50 MCG tablet TAKE 1 TABLET IN THE MORNING ON AN EMPTY STOMACH    [provider]   lisinopril (ZESTRIL) 10 MG tablet TAKE 1 TABLET EVERY DAY (PLEASE KEEP APPOINTMENT FOR 12/02/2019)    [provider]  primidone (MYSOLINE) 50 MG tablet TAKE 1 TABLET DAILY 07/22/21   Tat, Octaviano Batty, DO    Family History Family History  Problem Relation Age of Onset   Kidney Stones Father    CAD Father    Heart attack Brother 62    Social History Social History   Tobacco Use   Smoking status: Never   Smokeless tobacco: Never  Vaping Use   Vaping Use: Never used  Substance Use Topics   Alcohol use: Not Currently   Drug use: Never     Allergies   Codeine, Keflex [cephalexin], Penicillins, and Nubain [nalbuphine]   Review of Systems Review of Systems  Constitutional: Negative.   Musculoskeletal: Negative.   Neurological:  Positive for numbness. Negative for dizziness, facial asymmetry and headaches.     Physical Exam Triage Vital Signs ED Triage Vitals [09/30/21 1606]  Enc Vitals Group     BP (!) 148/63     Pulse Rate 67     Resp 18     Temp 98.1 F (36.7 C)     Temp src  SpO2 97 %     Weight      Height      Head Circumference      Peak Flow      Pain Score 3     Pain Loc      Pain Edu?      Excl. in [place]?    No data found.  Updated Vital Signs BP (!) 148/63 (BP Location: Right Arm)   Pulse 67   Temp 98.1 F (36.7 C)   Resp 18   SpO2 97%   Visual Acuity Right Eye Distance:   Left Eye Distance:   Bilateral Distance:    Right Eye Near:   Left Eye Near:    Bilateral Near:     Physical Exam Vitals and nursing note reviewed.  Constitutional:      General: He is not in acute distress.    Appearance: He is not ill-appearing.  Cardiovascular:     Rate and Rhythm: Normal rate and regular rhythm.  Musculoskeletal:        General: Normal range of motion.     Comments: Nontender soft mass noted on the left wrist.  Power is 4+ in the left hand.  Grip strength is slightly diminished compared to the right hand.  No bruising or erythema  noted.  Neurological:     Mental Status: He is alert.      UC Treatments / Results  Labs (all labs ordered are listed, but only abnormal results are displayed) Labs Reviewed - No data to display  EKG   Radiology No results found.  Procedures Procedures (including critical care time)  Medications Ordered in UC Medications - No data to display  Initial Impression / Assessment and Plan / UC Course  I have reviewed the triage vital signs and the nursing notes.  Pertinent labs & imaging results that were available during my care of the patient were reviewed by me and considered in my medical decision making (see chart for details).     1.  Left wrist pain with decreased grip strength: Patient is advised to follow-up with hand surgeon for further evaluation.  The weakness in grip may be related to the falls few months ago.  Patient agrees with the plan of care. Final Clinical Impressions(s) / UC Diagnoses   Final diagnoses:  Left wrist pain     Discharge Instructions      Please call hand surgeon's office and schedule an appointment to be evaluated further.   ED Prescriptions   None    PDMP not reviewed this encounter.   Merrilee Jansky, MD 09/30/21 1729    Merrilee Jansky, MD 09/30/21 [phone]

## 2021-09-30 NOTE — ED Triage Notes (Signed)
Pt states tripped and fell 3 months ago and blocked the fall with palm of lt hand. Pt c/o pain to lt hand with weakness.

## 2021-10-08 ENCOUNTER — Ambulatory Visit (INDEPENDENT_AMBULATORY_CARE_PROVIDER_SITE_OTHER): Payer: Medicare (Managed Care) | Admitting: Surgery

## 2021-10-08 ENCOUNTER — Ambulatory Visit: Payer: Self-pay

## 2021-10-08 ENCOUNTER — Encounter: Payer: Self-pay | Admitting: Surgery

## 2021-10-08 VITALS — BP 145/79 | HR 71

## 2021-10-08 DIAGNOSIS — M79642 Pain in left hand: Secondary | ICD-10-CM

## 2021-10-08 DIAGNOSIS — M19042 Primary osteoarthritis, left hand: Secondary | ICD-10-CM

## 2021-10-08 DIAGNOSIS — G5692 Unspecified mononeuropathy of left upper limb: Secondary | ICD-10-CM | POA: Diagnosis not present

## 2021-10-08 NOTE — Progress Notes (Signed)
Office Visit Note   Patient: Logan Shepherd           Date of Birth: May 11, 1950           MRN: [id number] Visit Date: 10/08/2021              Requested by: Darrow Bussing, MD [address] [place],  [place] [address] PCP: Darrow Bussing, MD   Assessment & Plan: Visit Diagnoses:  1. Pain in left hand   2. Neuropathy of left hand   3. Arthritis of left hand     Plan: At this point recommend conservative treatment.  I will have patient follow-up in 2 weeks for recheck and we will make decision at that time as to whether or not he needs NCV/EMG study left upper extremity or MRI left wrist to rule out possible cartilage tear.  All questions answered.  We will continue to use ice off-and-on as needed.  Follow-Up Instructions: Return in about 2 weeks (around 10/22/2021) for with Anntonette Madewell recheck left hand and wrist.   Orders:  Orders Placed This Encounter  Procedures   XR Hand Complete Left   No orders of the defined types were placed in this encounter.     Procedures: No procedures performed   Clinical Data: No additional findings.   Subjective: Chief Complaint  Patient presents with   Left Hand - Pain   Left Wrist - Pain    HPI 71 year old male comes in today with complaints of left wrist and hand pain.  Patient states that 4 months ago he fell landing onto an outstretched hand.  States that he feels like his hand gets weak.  Has tried using Tylenol ice and heat without relief.  Patient was in the [facility] urgent care September 30, 2021 but no x-rays were done.   Review of Systems No current complaints of cardiopulmonary GI/GU issues  Objective: Vital Signs: BP (!) 145/79   Pulse 71   Physical Exam Constitutional:      Appearance: Normal appearance.  HENT:     Head: Normocephalic and atraumatic.     Nose: Nose normal.  Eyes:     Extraocular Movements: Extraocular movements intact.  Pulmonary:     Effort: No respiratory distress.   Musculoskeletal:     Comments: Patient good range of motion of the wrist but with some discomfort.  Some tenderness along the dorsal aspect.  Positive Tinel's.  Neurological:     Mental Status: He is oriented to person, place, and time.  Psychiatric:        Mood and Affect: Mood normal.     Ortho Exam  Specialty Comments:  No specialty comments available.  Imaging: No results found.   PMFS History: Patient Active Problem List   Diagnosis Date Noted   COVID-19 virus infection 10/22/2019   Past Medical History:  Diagnosis Date   Hyperlipidemia    Hypertension    Hypothyroidism    Nephrolithiasis    Neuropathy    Renal insufficiency     Family History  Problem Relation Age of Onset   Kidney Stones Father    CAD Father    Heart attack Brother 78    Past Surgical History:  Procedure Laterality Date   BACK SURGERY     lumbar   NECK SURGERY     x 4   Social History   Occupational History   Not on file  Tobacco Use   Smoking status: Never   Smokeless  tobacco: Never  Vaping Use   Vaping Use: Never used  Substance and Sexual Activity   Alcohol use: Not Currently   Drug use: Never   Sexual activity: Not on file

## 2021-10-28 ENCOUNTER — Encounter: Payer: Self-pay | Admitting: Surgery

## 2021-10-28 ENCOUNTER — Ambulatory Visit (INDEPENDENT_AMBULATORY_CARE_PROVIDER_SITE_OTHER): Payer: Medicare (Managed Care) | Admitting: Surgery

## 2021-10-28 VITALS — BP 128/71 | HR 81 | Ht 69.0 in | Wt 251.6 lb

## 2021-10-28 DIAGNOSIS — M25532 Pain in left wrist: Secondary | ICD-10-CM

## 2021-10-28 NOTE — Progress Notes (Signed)
   Office Visit Note   Patient: Logan Shepherd           Date of Birth: 08-12-1950           MRN: [id number] Visit Date: 10/28/2021              Requested by: Darrow Bussing, MD [address] [place],  [place] [address] PCP: Darrow Bussing, MD   Assessment & Plan: Visit Diagnoses:  1. Pain in left wrist     Plan: Since patient has not had any improvement at this point I will order left wrist MRI arthrogram.  Follow-up with Dr. Ophelia Charter in 2 weeks for recheck to discuss results and further treatment options.  Advised patient that may need referral to hand specialist.  Continue wearing brace until return office visit.  Follow-Up Instructions: Return in about 2 weeks (around 11/11/2021) for With Dr. Ophelia Charter for MRI review left wrist.   Orders:  Orders Placed This Encounter  Procedures   MR Wrist Left w/ contrast   Arthrogram   No orders of the defined types were placed in this encounter.     Procedures: No procedures performed   Clinical Data: No additional findings.   Subjective: Chief Complaint  Patient presents with   Left Hand - Follow-up    HPI 71 year old male returns for recheck of his left wrist pain.  States that he continues have pain throughout his wrist and hand.  No swelling.  He has used a brace.    Objective: Vital Signs: BP 128/71   Pulse 81   Ht 5\' 9"  (1.753 m)   Wt 251 lb 9.6 oz (114.1 kg)   BMI 37.15 kg/m   Physical Exam Exam left wrist he is diffusely tender over the dorsal aspect.  Some limitation range of motion with pain.  Minimally positive Tinel's. Ortho Exam  Specialty Comments:  No specialty comments available.  Imaging: No results found.   PMFS History: Patient Active Problem List   Diagnosis Date Noted   COVID-19 virus infection 10/22/2019   Past Medical History:  Diagnosis Date   Hyperlipidemia    Hypertension    Hypothyroidism    Nephrolithiasis    Neuropathy    Renal insufficiency     Family  History  Problem Relation Age of Onset   Kidney Stones Father    CAD Father    Heart attack Brother 20    Past Surgical History:  Procedure Laterality Date   BACK SURGERY     lumbar   NECK SURGERY     x 4   Social History   Occupational History   Not on file  Tobacco Use   Smoking status: Never   Smokeless tobacco: Never  Vaping Use   Vaping Use: Never used  Substance and Sexual Activity   Alcohol use: Not Currently   Drug use: Never   Sexual activity: Not on file

## 2021-11-01 NOTE — Progress Notes (Deleted)
Assessment/Plan:    1.  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  He is possibly considering botox in the future but looking into different insurance plans  2.  F/u 9 months-1 year  Subjective:   Logan Shepherd was seen today in follow up for dystonic tremor.  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  Patient continues to do well with primidone, 50 mg nightly, without side effects.  Current prescribed movement disorder medications: Primidone 50 mg daily   PREVIOUS MEDICATIONS: propranolol (was on it in 2015 for several years and it helped, but apparently his nephrologist did not want him on it)  ALLERGIES:   Allergies  Allergen Reactions   Codeine Nausea Only   Keflex [Cephalexin] Nausea Only   Penicillins Swelling   Nubain [Nalbuphine] Anxiety    CURRENT MEDICATIONS:  Outpatient Encounter Medications as of 11/02/2021  Medication Sig   albuterol (VENTOLIN HFA) 108 (90 Base) MCG/ACT inhaler Inhale 2 puffs into the lungs every 4 (four) hours as needed for wheezing or shortness of breath.   allopurinol (ZYLOPRIM) 100 MG tablet Take 100 mg by mouth daily.   atorvastatin (LIPITOR) 20 MG tablet 1 tablet   colchicine 0.6 MG tablet Take 1 tablet (0.6 mg total) by mouth daily for 7 days.   cyclobenzaprine (FLEXERIL) 5 MG tablet 1 tablet as needed   furosemide (LASIX) 20 MG tablet Take 1 tablet by mouth daily.   gabapentin (NEURONTIN) 300 MG capsule 1 capsule   levothyroxine (SYNTHROID) 50 MCG tablet TAKE 1 TABLET IN THE MORNING ON AN EMPTY STOMACH   lisinopril (ZESTRIL) 10 MG tablet TAKE 1 TABLET EVERY DAY (PLEASE KEEP APPOINTMENT FOR 12/02/2019)   primidone (MYSOLINE) 50 MG tablet TAKE 1 TABLET DAILY   No facility-administered encounter medications on file as of 11/02/2021.     Objective:    PHYSICAL EXAMINATION:    VITALS:    There were no vitals filed for this visit.   Gen:  Appears stated age and in NAD. HEENT:  Normocephalic, atraumatic. The mucous membranes are moist.  Neck: There are no carotid bruits noted bilaterally.  Neck is turned to the right and slightly SB to the left.  There is hypertrophy of the left SCM.  Head rotation is limited to the left.   NEUROLOGICAL:   Orientation:  The patient is alert and oriented x 3.   Cranial nerves: There is good facial symmetry. Extraocular muscles are intact and visual fields are full to confrontational testing. Speech is fluent and clear. Marland Kitchen Hearing is intact to conversational tone. Sensation: Sensation is intact to light touch touch throughout  Coordination:  The patient has no dysdiadichokinesia or dysmetria. Gait and Station: The patient is able to ambulate without difficulty.    MOVEMENT EXAM: Tremor:  There is no postural tremor or intention tremor today.  No significant tremor.   The patient is  able to draw Archimedes spirals without significant difficulty.  There is no tremor at rest.   I have reviewed and interpreted the following labs independently   Chemistry      Component Value Date/Time   NA 132 (L) 10/15/2018 2032   K 4.8 10/15/2018 2032   CL 96 10/15/2018 2032   CO2 20 10/15/2018 2032   BUN 55 (H) 10/15/2018 2032   CREATININE 3.16 (H) 10/15/2018 2032  Component Value Date/Time   CALCIUM 9.9 10/15/2018 2032      No results found for: "WBC", "HGB", "HCT", "MCV", "PLT" No results found for: "TSH"   Chemistry      Component Value Date/Time   NA 132 (L) 10/15/2018 2032   K 4.8 10/15/2018 2032   CL 96 10/15/2018 2032   CO2 20 10/15/2018 2032   BUN 55 (H) 10/15/2018 2032   CREATININE 3.16 (H) 10/15/2018 2032      Component Value Date/Time   CALCIUM 9.9 10/15/2018 2032        Cc:  Darrow Bussing, MD

## 2021-11-02 ENCOUNTER — Ambulatory Visit: Payer: Medicare (Managed Care) | Admitting: Neurology

## 2021-11-02 ENCOUNTER — Encounter: Payer: Self-pay | Admitting: Neurology

## 2021-11-02 NOTE — Progress Notes (Deleted)
Assessment/Plan:    1.  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  He is possibly considering botox in the future but looking into different insurance plans  2.  F/u 9 months-1 year  Subjective:   Logan Shepherd was seen today in follow up for dystonic tremor.  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  Patient continues to do well with primidone, 50 mg nightly, without side effects.  Current prescribed movement disorder medications: Primidone 50 mg daily   PREVIOUS MEDICATIONS: propranolol (was on it in 2015 for several years and it helped, but apparently his nephrologist did not want him on it)  ALLERGIES:   Allergies  Allergen Reactions   Codeine Nausea Only   Keflex [Cephalexin] Nausea Only   Penicillins Swelling   Nubain [Nalbuphine] Anxiety    CURRENT MEDICATIONS:  Outpatient Encounter Medications as of 11/11/2021  Medication Sig   albuterol (VENTOLIN HFA) 108 (90 Base) MCG/ACT inhaler Inhale 2 puffs into the lungs every 4 (four) hours as needed for wheezing or shortness of breath.   allopurinol (ZYLOPRIM) 100 MG tablet Take 100 mg by mouth daily.   atorvastatin (LIPITOR) 20 MG tablet 1 tablet   colchicine 0.6 MG tablet Take 1 tablet (0.6 mg total) by mouth daily for 7 days.   cyclobenzaprine (FLEXERIL) 5 MG tablet 1 tablet as needed   furosemide (LASIX) 20 MG tablet Take 1 tablet by mouth daily.   gabapentin (NEURONTIN) 300 MG capsule 1 capsule   levothyroxine (SYNTHROID) 50 MCG tablet TAKE 1 TABLET IN THE MORNING ON AN EMPTY STOMACH   lisinopril (ZESTRIL) 10 MG tablet TAKE 1 TABLET EVERY DAY (PLEASE KEEP APPOINTMENT FOR 12/02/2019)   primidone (MYSOLINE) 50 MG tablet TAKE 1 TABLET DAILY   No facility-administered encounter medications on file as of 11/11/2021.     Objective:    PHYSICAL EXAMINATION:    VITALS:    There were no vitals filed for this visit.   Gen:  Appears stated age and in NAD. HEENT:  Normocephalic, atraumatic. The mucous membranes are moist.  Neck: There are no carotid bruits noted bilaterally.  Neck is turned to the right and slightly SB to the left.  There is hypertrophy of the left SCM.  Head rotation is limited to the left.   NEUROLOGICAL:   Orientation:  The patient is alert and oriented x 3.   Cranial nerves: There is good facial symmetry. Extraocular muscles are intact and visual fields are full to confrontational testing. Speech is fluent and clear. Marland Kitchen Hearing is intact to conversational tone. Sensation: Sensation is intact to light touch touch throughout  Coordination:  The patient has no dysdiadichokinesia or dysmetria. Gait and Station: The patient is able to ambulate without difficulty.    MOVEMENT EXAM: Tremor:  There is no postural tremor or intention tremor today.  No significant tremor.   The patient is  able to draw Archimedes spirals without significant difficulty.  There is no tremor at rest.   I have reviewed and interpreted the following labs independently   Chemistry      Component Value Date/Time   NA 132 (L) 10/15/2018 2032   K 4.8 10/15/2018 2032   CL 96 10/15/2018 2032   CO2 20 10/15/2018 2032   BUN 55 (H) 10/15/2018 2032   CREATININE 3.16 (H) 10/15/2018 2032  Component Value Date/Time   CALCIUM 9.9 10/15/2018 2032      No results found for: "WBC", "HGB", "HCT", "MCV", "PLT" No results found for: "TSH"   Chemistry      Component Value Date/Time   NA 132 (L) 10/15/2018 2032   K 4.8 10/15/2018 2032   CL 96 10/15/2018 2032   CO2 20 10/15/2018 2032   BUN 55 (H) 10/15/2018 2032   CREATININE 3.16 (H) 10/15/2018 2032      Component Value Date/Time   CALCIUM 9.9 10/15/2018 2032        Cc:  Darrow Bussing, MD

## 2021-11-05 ENCOUNTER — Ambulatory Visit (INDEPENDENT_AMBULATORY_CARE_PROVIDER_SITE_OTHER): Payer: Medicare (Managed Care) | Admitting: Neurology

## 2021-11-05 ENCOUNTER — Encounter: Payer: Self-pay | Admitting: Neurology

## 2021-11-05 VITALS — BP 131/82 | HR 67 | Ht 72.0 in | Wt 250.2 lb

## 2021-11-05 DIAGNOSIS — R251 Tremor, unspecified: Secondary | ICD-10-CM | POA: Diagnosis not present

## 2021-11-05 DIAGNOSIS — M79642 Pain in left hand: Secondary | ICD-10-CM | POA: Diagnosis not present

## 2021-11-05 DIAGNOSIS — G243 Spasmodic torticollis: Secondary | ICD-10-CM | POA: Diagnosis not present

## 2021-11-05 NOTE — Progress Notes (Signed)
Assessment/Plan:    1.  Dystonic tremor  -We will continue with low-dose primidone, 50 mg daily.  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  This really is not bothering him.  2.  Left hand pain  -We were going to do an EMG, but I did see that he is following with orthopedics and they are doing an MRI and then going to consider EMG.  He can let us know if he needs this in the future.  3.  Follow-up 1 year.  Subjective:   Logan Shepherd was seen today in follow up for dystonic tremor.  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  Patient continues to do well with primidone, 50 mg nightly, without side effects.  He does not feel he needs an increase.  He does tell me he fell about 4 months ago.  He tripped over a stair and then not longer after tripped over some toys while watching his grandchildren.  He fell on the hand and knew it was painful, but kind of ignored it.  However, he became worried when the hand became somewhat weak.  He is now seeing orthopedics and awaiting an MRI of the hand.  Current prescribed movement disorder medications: Primidone 50 mg daily   PREVIOUS MEDICATIONS: propranolol (was on it in 2015 for several years and it helped, but apparently his nephrologist did not want him on it)  ALLERGIES:   Allergies  Allergen Reactions   Codeine Nausea Only   Keflex [Cephalexin] Nausea Only   Penicillins Swelling   Nubain [Nalbuphine] Anxiety    CURRENT MEDICATIONS:  Outpatient Encounter Medications as of 11/05/2021  Medication Sig   albuterol (VENTOLIN HFA) 108 (90 Base) MCG/ACT inhaler Inhale 2 puffs into the lungs every 4 (four) hours as needed for wheezing or shortness of breath.   allopurinol (ZYLOPRIM) 100 MG tablet Take 100 mg by mouth daily.   atorvastatin (LIPITOR) 20 MG tablet 1 tablet   colchicine 0.6 MG tablet Take 1 tablet (0.6 mg total) by mouth daily for 7 days.    cyclobenzaprine (FLEXERIL) 5 MG tablet 1 tablet as needed   furosemide (LASIX) 20 MG tablet Take 1 tablet by mouth daily.   gabapentin (NEURONTIN) 300 MG capsule 1 capsule   levothyroxine (SYNTHROID) 50 MCG tablet TAKE 1 TABLET IN THE MORNING ON AN EMPTY STOMACH   lisinopril (ZESTRIL) 10 MG tablet TAKE 1 TABLET EVERY DAY (PLEASE KEEP APPOINTMENT FOR 12/02/2019)   primidone (MYSOLINE) 50 MG tablet TAKE 1 TABLET DAILY   No facility-administered encounter medications on file as of 11/05/2021.     Objective:    PHYSICAL EXAMINATION:    VITALS:   There were no vitals filed for this visit.   Gen:  Appears stated age and in NAD. HEENT:  Normocephalic, atraumatic. The mucous membranes are moist.  Neck: There are no carotid bruits noted bilaterally.  Neck is turned to the right and slightly SB to the left.  There is hypertrophy of the left SCM.  Head rotation is limited to the left.   NEUROLOGICAL:   Orientation:  The patient is alert and oriented x 3.   Cranial nerves: There is good facial symmetry. Extraocular muscles are intact and visual fields are full to confrontational testing. Speech is fluent and clear. Marland Kitchen Hearing is intact to conversational tone. Sensation: Sensation is intact to light touch touch throughout  Coordination:  The patient has  no dysdiadichokinesia or dysmetria. Gait and Station: The patient is able to ambulate without difficulty.  Motor: There is some mild decreased grasp of the left hand.  He has good flexion and extension of the left wrist.  He is able to extend the fingers, but there is some mild weakness there (some limited due to pain).   MOVEMENT EXAM: Tremor:  There is no postural tremor or intention tremor today.  No significant tremor.   The patient is  able to draw Archimedes spirals without significant difficulty.  There is no tremor at rest.  There is head tremor if distracted significantly. I have reviewed and interpreted the following labs independently    Chemistry      Component Value Date/Time   NA 132 (L) 10/15/2018 2032   K 4.8 10/15/2018 2032   CL 96 10/15/2018 2032   CO2 20 10/15/2018 2032   BUN 55 (H) 10/15/2018 2032   CREATININE 3.16 (H) 10/15/2018 2032      Component Value Date/Time   CALCIUM 9.9 10/15/2018 2032      No results found for: "WBC", "HGB", "HCT", "MCV", "PLT" No results found for: "TSH"   Chemistry      Component Value Date/Time   NA 132 (L) 10/15/2018 2032   K 4.8 10/15/2018 2032   CL 96 10/15/2018 2032   CO2 20 10/15/2018 2032   BUN 55 (H) 10/15/2018 2032   CREATININE 3.16 (H) 10/15/2018 2032      Component Value Date/Time   CALCIUM 9.9 10/15/2018 2032        Cc:  Darrow Bussing, MD

## 2021-11-05 NOTE — Patient Instructions (Signed)
The physicians and staff at [facility] are committed to providing excellent care. You may receive a survey requesting feedback about your experience at our office. We strive to receive "very good" responses to the survey questions. If you feel that your experience would prevent you from giving the office a "very good " response, please contact our office to try to remedy the situation. We may be reached at [phone]. Thank you for taking the time out of your busy day to complete the survey.  

## 2021-11-11 ENCOUNTER — Ambulatory Visit: Payer: Medicare (Managed Care) | Admitting: Neurology

## 2021-11-12 ENCOUNTER — Ambulatory Visit (HOSPITAL_COMMUNITY): Payer: Self-pay

## 2021-11-26 ENCOUNTER — Other Ambulatory Visit: Payer: Medicare (Managed Care)

## 2021-12-17 DIAGNOSIS — L309 Dermatitis, unspecified: Secondary | ICD-10-CM | POA: Diagnosis not present

## 2021-12-17 DIAGNOSIS — I10 Essential (primary) hypertension: Secondary | ICD-10-CM | POA: Diagnosis not present

## 2021-12-17 DIAGNOSIS — E1169 Type 2 diabetes mellitus with other specified complication: Secondary | ICD-10-CM | POA: Diagnosis not present

## 2021-12-17 DIAGNOSIS — M25561 Pain in right knee: Secondary | ICD-10-CM | POA: Diagnosis not present

## 2021-12-17 DIAGNOSIS — R600 Localized edema: Secondary | ICD-10-CM | POA: Diagnosis not present

## 2021-12-17 DIAGNOSIS — E039 Hypothyroidism, unspecified: Secondary | ICD-10-CM | POA: Diagnosis not present

## 2021-12-17 DIAGNOSIS — G250 Essential tremor: Secondary | ICD-10-CM | POA: Diagnosis not present

## 2021-12-17 DIAGNOSIS — Z79899 Other long term (current) drug therapy: Secondary | ICD-10-CM | POA: Diagnosis not present

## 2021-12-17 DIAGNOSIS — G629 Polyneuropathy, unspecified: Secondary | ICD-10-CM | POA: Diagnosis not present

## 2021-12-17 DIAGNOSIS — M1A9XX0 Chronic gout, unspecified, without tophus (tophi): Secondary | ICD-10-CM | POA: Diagnosis not present

## 2021-12-27 ENCOUNTER — Other Ambulatory Visit: Payer: Self-pay | Admitting: Neurology

## 2021-12-27 DIAGNOSIS — G243 Spasmodic torticollis: Secondary | ICD-10-CM

## 2021-12-27 DIAGNOSIS — R251 Tremor, unspecified: Secondary | ICD-10-CM

## 2021-12-29 DIAGNOSIS — M25561 Pain in right knee: Secondary | ICD-10-CM | POA: Diagnosis not present

## 2022-02-18 ENCOUNTER — Telehealth: Payer: Medicare (Managed Care) | Admitting: Physician Assistant

## 2022-02-18 DIAGNOSIS — B9689 Other specified bacterial agents as the cause of diseases classified elsewhere: Secondary | ICD-10-CM

## 2022-02-18 DIAGNOSIS — J069 Acute upper respiratory infection, unspecified: Secondary | ICD-10-CM | POA: Diagnosis not present

## 2022-02-18 MED ORDER — FLUTICASONE PROPIONATE 50 MCG/ACT NA SUSP: 2.0000 | Freq: Every day | NASAL | 0 refills | Status: DC

## 2022-02-18 MED ORDER — DOXYCYCLINE HYCLATE 100 MG PO TABS: 100.0000 mg | ORAL_TABLET | Freq: Two times a day (BID) | ORAL | 0 refills | Status: DC

## 2022-02-18 MED ORDER — BENZONATATE 100 MG PO CAPS: 100.0000 mg | ORAL_CAPSULE | Freq: Three times a day (TID) | ORAL | 0 refills | Status: DC | PRN

## 2022-02-18 NOTE — Progress Notes (Signed)
E-Visit for Upper Respiratory Infection   We are sorry you are not feeling well.  Here is how we plan to help!  Based on what you have shared with me, it looks like you may have a bacterial upper respiratory infection.    Symptoms vary from person to person, with common symptoms including sore throat, cough, fatigue or lack of energy and feeling of general discomfort.  A low-grade fever of up to 100.4 may present, but is often uncommon.  Symptoms vary however, and are closely related to a person's age or underlying illnesses.  The most common symptoms associated with an upper respiratory infection are nasal discharge or congestion, cough, sneezing, headache and pressure in the ears and face.  These symptoms usually persist for about 3 to 10 days, but can last up to 2 weeks.  It is important to know that upper respiratory infections do not cause serious illness or complications in most cases.    Upper respiratory infections can be transmitted from person to person, with the most common method of transmission being a person's hands. Also, these can be transmitted when someone coughs or sneezes; thus, it is important to cover the mouth to reduce this risk.  To keep the spread of the illness at bay, good hand hygiene is very important.  I have prescribed Doxycycline 100mg  tablet, take 1 tablet twice daily for 10 days  For nasal congestion, you may use an oral decongestants such as Mucinex D or if you have glaucoma or high blood pressure use plain Mucinex.  Saline nasal spray or nasal drops can help and can safely be used as often as needed for congestion.  For your congestion, I have prescribed Fluticasone nasal spray one spray in each nostril twice a day  If you do not have a history of heart disease, hypertension, diabetes or thyroid disease, prostate/bladder issues or glaucoma, you may also use Sudafed to treat nasal congestion.  It is highly recommended that you consult with a pharmacist or your  primary care physician to ensure this medication is safe for you to take.     If you have a cough, you may use cough suppressants such as Delsym and Robitussin.  If you have glaucoma or high blood pressure, you can also use Coricidin HBP.   For cough I have prescribed for you A prescription cough medication called Tessalon Perles 100 mg. You may take 1-2 capsules every 8 hours as needed for cough  If you have a sore or scratchy throat, use a saltwater gargle-  to  teaspoon of salt dissolved in a 4-ounce to 8-ounce glass of warm water.  Gargle the solution for approximately 15-30 seconds and then spit.  It is important not to swallow the solution.  You can also use throat lozenges/cough drops and Chloraseptic spray to help with throat pain or discomfort.  Warm or cold liquids can also be helpful in relieving throat pain.  For headache, pain or general discomfort, you can use Ibuprofen or Tylenol as directed.   Some authorities believe that zinc sprays or the use of Echinacea may shorten the course of your symptoms.   HOME CARE Only take medications as instructed by your medical team. Be sure to drink plenty of fluids. Water is fine as well as fruit juices, sodas and electrolyte beverages. You may want to stay away from caffeine or alcohol. If you are nauseated, try taking small sips of liquids. How do you know if you are getting enough fluid?  Your urine should be a pale yellow or almost colorless. Get rest. Taking a steamy shower or using a humidifier may help nasal congestion and ease sore throat pain. You can place a towel over your head and breathe in the steam from hot water coming from a faucet. Using a saline nasal spray works much the same way. Cough drops, hard candies and sore throat lozenges may ease your cough. Avoid close contacts especially the very young and the elderly Cover your mouth if you cough or sneeze Always remember to wash your hands.   GET HELP RIGHT AWAY IF: You  develop worsening fever. If your symptoms do not improve within 10 days You develop yellow or green discharge from your nose over 3 days. You have coughing fits You develop a severe head ache or visual changes. You develop shortness of breath, difficulty breathing or start having chest pain Your symptoms persist after you have completed your treatment plan  MAKE SURE YOU  Understand these instructions. Will watch your condition. Will get help right away if you are not doing well or get worse.  Thank you for choosing an e-visit.  Your e-visit answers were reviewed by a board certified advanced clinical practitioner to complete your personal care plan. Depending upon the condition, your plan could have included both over the counter or prescription medications.  Please review your pharmacy choice. Make sure the pharmacy is open so you can pick up prescription now. If there is a problem, you may contact your provider through [employer] and have the prescription routed to another pharmacy.  Your safety is important to [place]. If you have drug allergies check your prescription carefully.   For the next 24 hours you can use MyChart to ask questions about today's visit, request a non-urgent call back, or ask for a work or school excuse. You will get an email in the next two days asking about your experience. I hope that your e-visit has been valuable and will speed your recovery.   I have spent 5 minutes in review of e-visit questionnaire, review and updating patient chart, medical decision making and response to patient.   Mar Daring, PA-C

## 2022-04-05 ENCOUNTER — Other Ambulatory Visit: Payer: Self-pay | Admitting: Neurology

## 2022-04-05 DIAGNOSIS — R251 Tremor, unspecified: Secondary | ICD-10-CM

## 2022-04-05 DIAGNOSIS — G243 Spasmodic torticollis: Secondary | ICD-10-CM

## 2022-07-16 ENCOUNTER — Encounter (HOSPITAL_COMMUNITY): Payer: Self-pay

## 2022-07-16 ENCOUNTER — Ambulatory Visit (HOSPITAL_COMMUNITY)
Admission: RE | Admit: 2022-07-16 | Discharge: 2022-07-16 | Disposition: A | Discharge status: Home / Self Care | Payer: Medicare HMO | Source: Ambulatory Visit | Attending: Emergency Medicine | Admitting: Emergency Medicine

## 2022-07-16 VITALS — BP 113/74 | HR 65 | Temp 97.9°F | Resp 18

## 2022-07-16 DIAGNOSIS — M79644 Pain in right finger(s): Secondary | ICD-10-CM

## 2022-07-16 MED ORDER — PREDNISONE 10 MG (21) PO TBPK: ORAL_TABLET | Freq: Every day | ORAL | 0 refills | Status: DC

## 2022-07-16 NOTE — ED Triage Notes (Signed)
Pt reports he has had pain in RT thumb for several weeks. Pt took  medication for gout in his foot and was hoping his thumb would get better . Pt reports Rt thumb still hurts.

## 2022-07-16 NOTE — ED Provider Notes (Signed)
[facility]    CSN: [id number] Arrival date & time: 07/16/22  1341      History   Chief Complaint Chief Complaint  Patient presents with   Finger Injury    Right thumb pain - Entered by patient   thumb pain    HPI Logan Shepherd is a 72 y.o. male.   Patient presents for evaluation of right thumb pain present for at least 1 to 2 weeks.  Able to complete range of motion but pain is felt with all movement.  Has worsened in severity and has begun to experience a throbbing sensation that interferes with sleep.  During the same timeframe was experiencing toe pain received treatment for gout with colchicine, toe pain improved but denies any improvement in the thumb.  Denies injury or trauma numbness or tingling.      Past Medical History:  Diagnosis Date   Hyperlipidemia    Hypertension    Hypothyroidism    Nephrolithiasis    Neuropathy    Renal insufficiency     Patient Active Problem List   Diagnosis Date Noted   COVID-19 virus infection 10/22/2019    Past Surgical History:  Procedure Laterality Date   BACK SURGERY     lumbar   NECK SURGERY     x 4       Home Medications    Prior to Admission medications   Medication Sig Start Date End Date Taking? Authorizing Provider  albuterol (VENTOLIN HFA) 108 (90 Base) MCG/ACT inhaler Inhale 2 puffs into the lungs every 4 (four) hours as needed for wheezing or shortness of breath. 05/31/21   Vallery Sa, Amy L, PA  allopurinol (ZYLOPRIM) 100 MG tablet Take 100 mg by mouth daily. 09/24/21   [provider]  atorvastatin (LIPITOR) 20 MG tablet 1 tablet    [provider]  benzonatate (TESSALON) 100 MG capsule Take 1 capsule (100 mg total) by mouth 3 (three) times daily as needed. 02/18/22   Margaretann Loveless, PA-C  colchicine 0.6 MG tablet Take 1 tablet (0.6 mg total) by mouth daily for 7 days. 02/12/21 02/19/21  Tomi Bamberger, PA-C  cyclobenzaprine (FLEXERIL) 5 MG tablet 1 tablet as needed  04/10/20   [provider]  doxycycline (VIBRA-TABS) 100 MG tablet Take 1 tablet (100 mg total) by mouth 2 (two) times daily. 02/18/22   Margaretann Loveless, PA-C  fluticasone (FLONASE) 50 MCG/ACT nasal spray Place 2 sprays into both nostrils daily. 02/18/22   Margaretann Loveless, PA-C  furosemide (LASIX) 20 MG tablet Take 1 tablet by mouth daily.    [provider]  gabapentin (NEURONTIN) 300 MG capsule 1 capsule 04/17/19   [provider]  levothyroxine (SYNTHROID) 50 MCG tablet TAKE 1 TABLET IN THE MORNING ON AN EMPTY STOMACH    [provider]  lisinopril (ZESTRIL) 10 MG tablet TAKE 1 TABLET EVERY DAY (PLEASE KEEP APPOINTMENT FOR 12/02/2019)    [provider]  primidone (MYSOLINE) 50 MG tablet TAKE 1 TABLET AT BEDTIME 04/05/22   Tat, Octaviano Batty, DO    Family History Family History  Problem Relation Age of Onset   Kidney Stones Father    CAD Father    Heart attack Brother 56    Social History Social History   Tobacco Use   Smoking status: Never   Smokeless tobacco: Never  Vaping Use   Vaping Use: Never used  Substance Use Topics   Alcohol use: Not Currently   Drug use:  Never     Allergies   Codeine, Keflex [cephalexin], Penicillins, and Nubain [nalbuphine]   Review of Systems Review of Systems   Physical Exam Triage Vital Signs ED Triage Vitals  Enc Vitals Group     BP 07/16/22 1355 113/74     Pulse Rate 07/16/22 1355 65     Resp 07/16/22 1355 18     Temp 07/16/22 1355 97.9 F (36.6 C)     Temp src --      SpO2 07/16/22 1355 93 %     Weight --      Height --      Head Circumference --      Peak Flow --      Pain Score 07/16/22 1353 7     Pain Loc --      Pain Edu? --      Excl. in GC? --    No data found.  Updated Vital Signs BP 113/74   Pulse 65   Temp 97.9 F (36.6 C)   Resp 18   SpO2 93%   Visual Acuity Right Eye Distance:   Left Eye Distance:   Bilateral Distance:    Right Eye Near:   Left  Eye Near:    Bilateral Near:     Physical Exam Constitutional:      Appearance: Normal appearance.  Eyes:     Extraocular Movements: Extraocular movements intact.  Pulmonary:     Effort: Pulmonary effort is normal.  Musculoskeletal:     Comments: Tenderness is present to the middle and distal phalanx of the right thumb without ecchymosis swelling or deformity, able to complete range of motion, sensation intact, capillary refill less than 3, 2+ radial pulse  Neurological:     Mental Status: He is alert and oriented to person, place, and time. Mental status is at baseline.      UC Treatments / Results  Labs (all labs ordered are listed, but only abnormal results are displayed) Labs Reviewed - No data to display  EKG   Radiology No results found.  Procedures Procedures (including critical care time)  Medications Ordered in UC Medications - No data to display  Initial Impression / Assessment and Plan / UC Course  I have reviewed the triage vital signs and the nursing notes.  Pertinent labs & imaging results that were available during my care of the patient were reviewed by me and considered in my medical decision making (see chart for details).  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   Valinda Hoar, [place] 07/16/22 [phone]

## 2022-07-16 NOTE — Discharge Instructions (Signed)
Pain to the numbness most likely being caused by an inflammatory condition such as arthritis which is why the colchicine was ineffective  As there was no injury to the thumb we did not complete x-ray imaging as the bone should be intact  Begin prednisone every morning as directed to reduce inflammation which in turn ideally will help with your pain, may take Tylenol every 6 hours as needed in addition to this medication  May use heat over the affected area in 10 to 15-minute intervals  May massage and stretch the arm as tolerated  If your symptoms continue to persist or worsen please follow-up with urgent care or your primary doctor for reevaluation

## 2022-07-29 DIAGNOSIS — E1140 Type 2 diabetes mellitus with diabetic neuropathy, unspecified: Secondary | ICD-10-CM | POA: Diagnosis not present

## 2022-07-29 DIAGNOSIS — G250 Essential tremor: Secondary | ICD-10-CM | POA: Diagnosis not present

## 2022-07-29 DIAGNOSIS — Z79899 Other long term (current) drug therapy: Secondary | ICD-10-CM | POA: Diagnosis not present

## 2022-07-29 DIAGNOSIS — I10 Essential (primary) hypertension: Secondary | ICD-10-CM | POA: Diagnosis not present

## 2022-07-29 DIAGNOSIS — E039 Hypothyroidism, unspecified: Secondary | ICD-10-CM | POA: Diagnosis not present

## 2022-07-29 DIAGNOSIS — M2550 Pain in unspecified joint: Secondary | ICD-10-CM | POA: Diagnosis not present

## 2022-07-29 DIAGNOSIS — Z125 Encounter for screening for malignant neoplasm of prostate: Secondary | ICD-10-CM | POA: Diagnosis not present

## 2022-07-29 DIAGNOSIS — Z0001 Encounter for general adult medical examination with abnormal findings: Secondary | ICD-10-CM | POA: Diagnosis not present

## 2022-07-29 DIAGNOSIS — M1A9XX0 Chronic gout, unspecified, without tophus (tophi): Secondary | ICD-10-CM | POA: Diagnosis not present

## 2022-08-19 DIAGNOSIS — M2550 Pain in unspecified joint: Secondary | ICD-10-CM | POA: Diagnosis not present

## 2022-08-19 DIAGNOSIS — E1140 Type 2 diabetes mellitus with diabetic neuropathy, unspecified: Secondary | ICD-10-CM | POA: Diagnosis not present

## 2022-09-09 IMAGING — DX DG CHEST 2V
2 series · 2 of 2 positions shown · non-contrast
Comparison: 06/18/2021

CLINICAL DATA: Cough 3 weeks

EXAM:
CHEST - 2 VIEW

[chest pa]
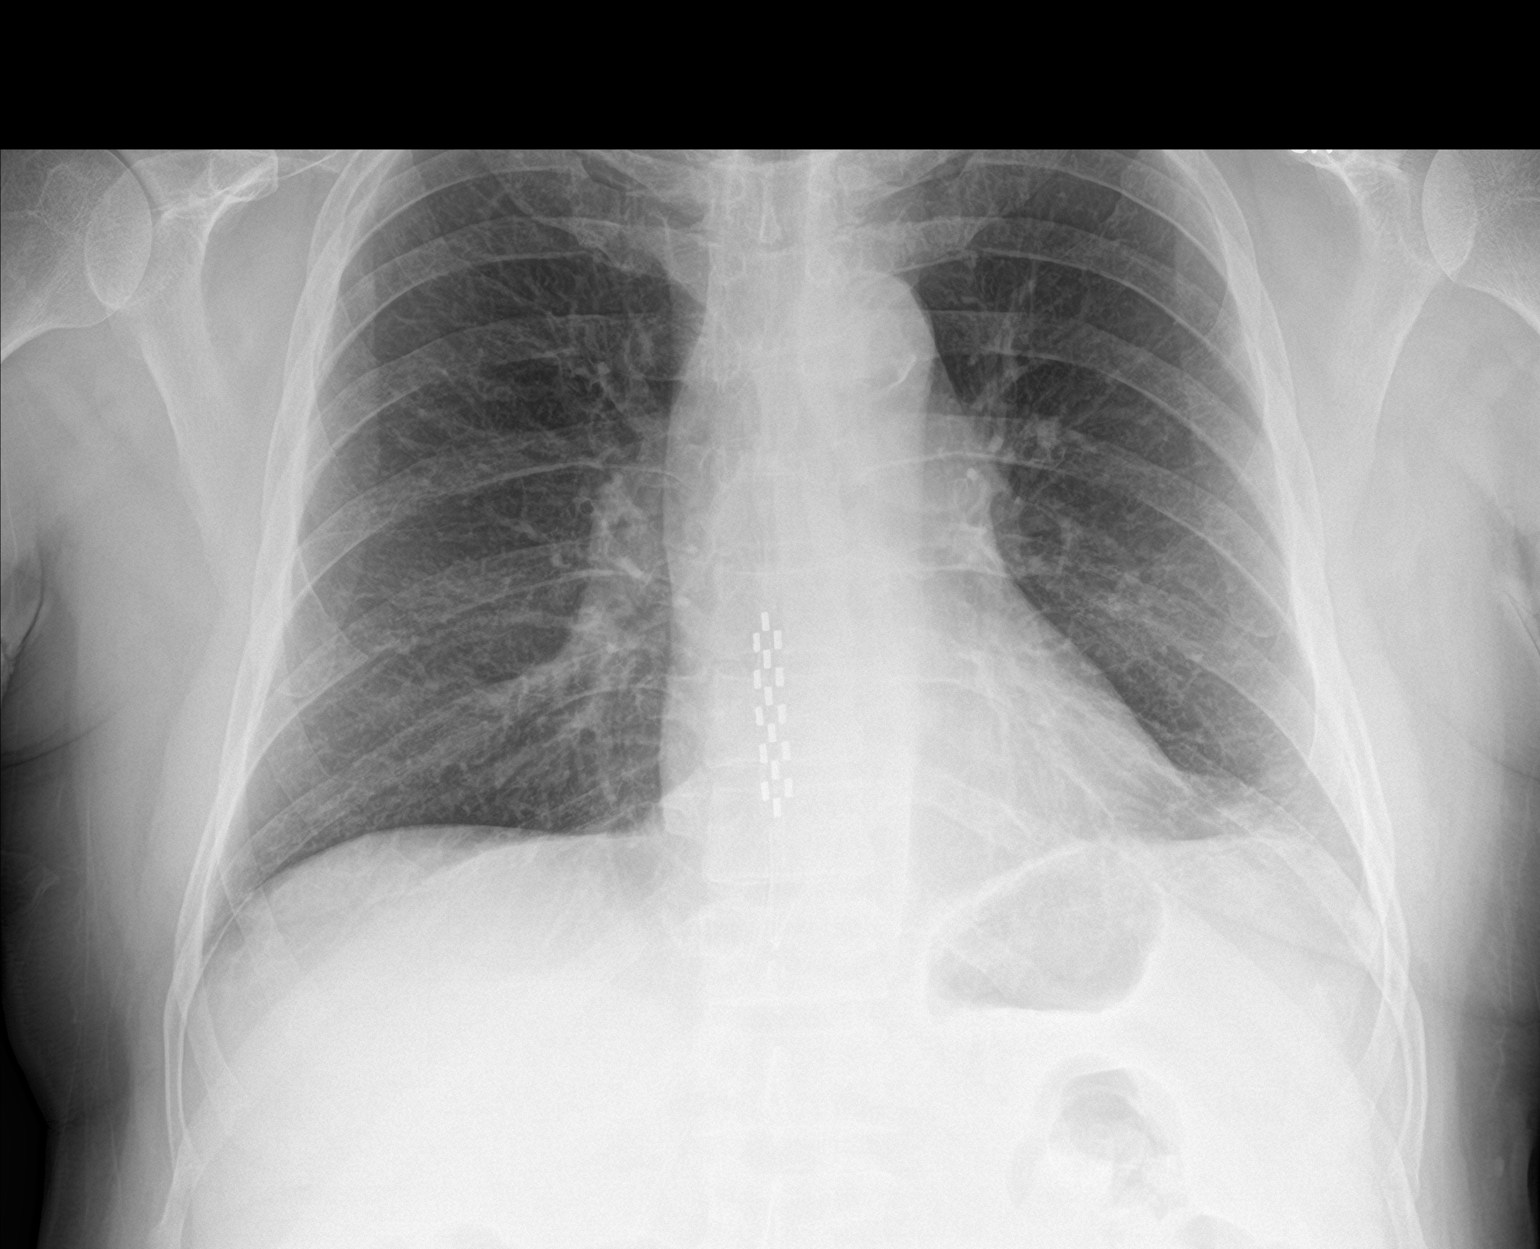

[chest lat]
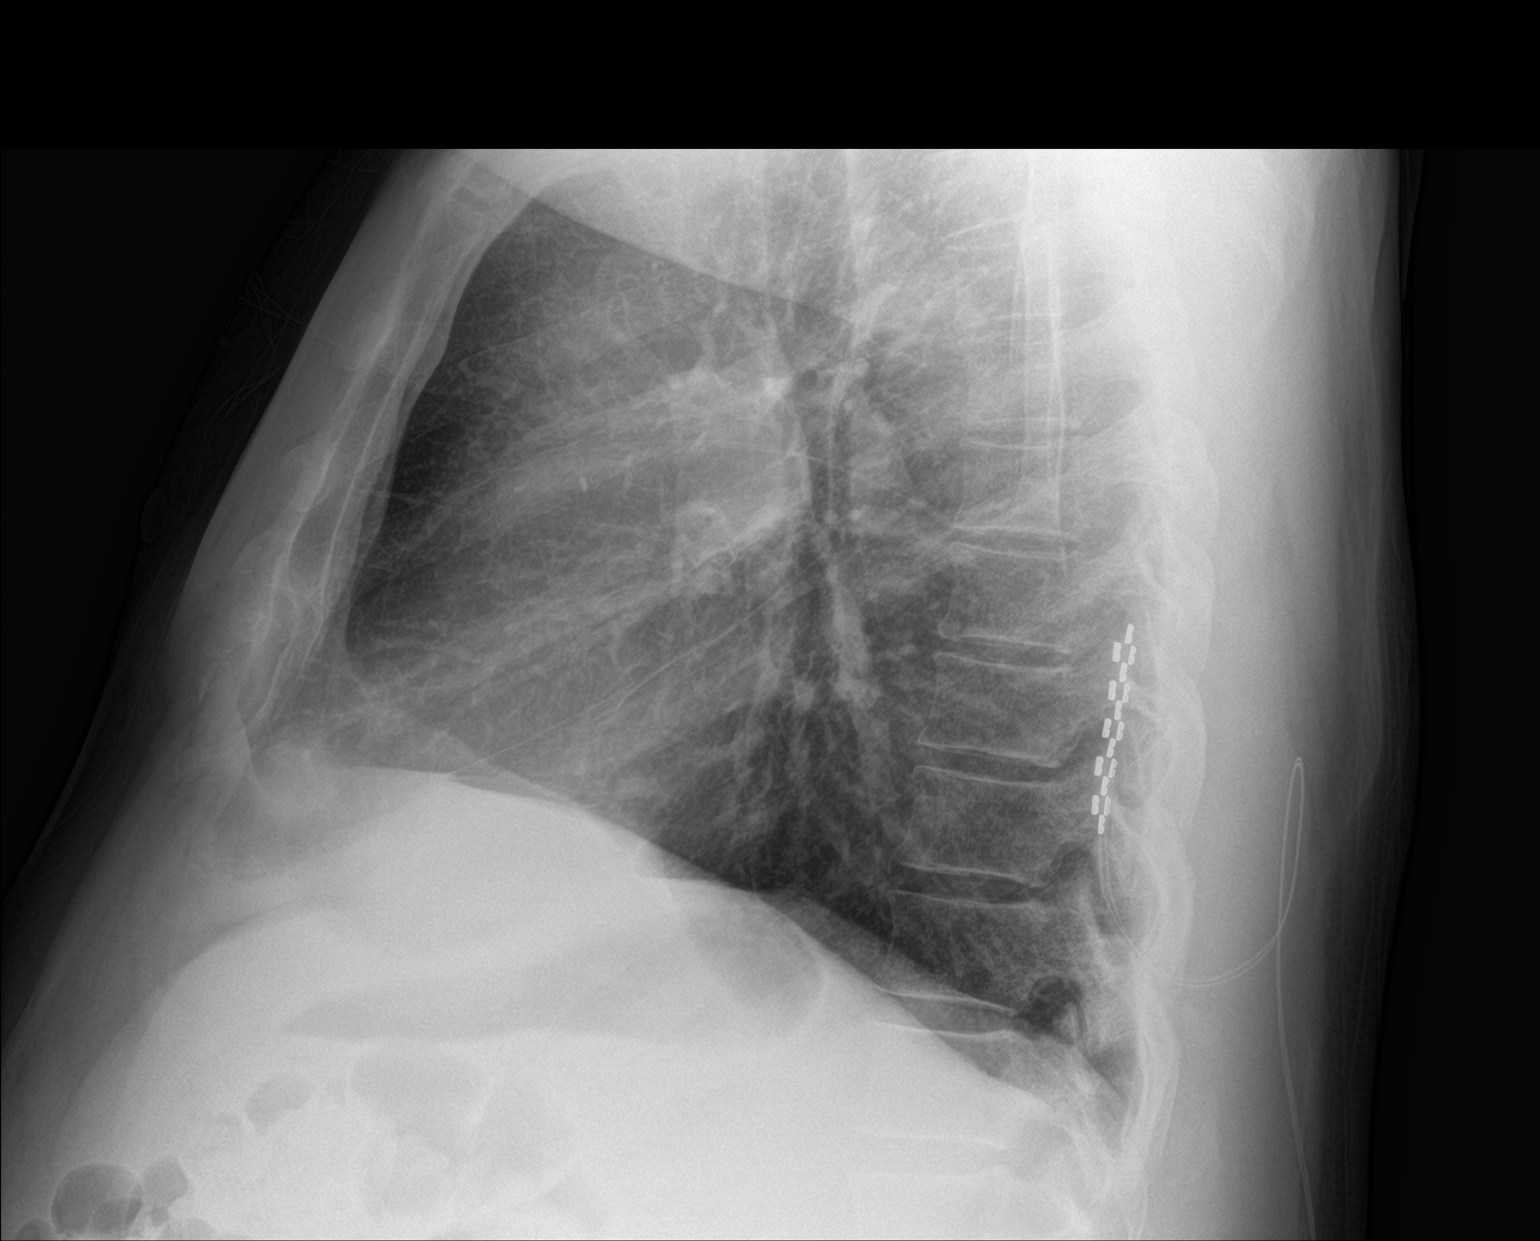

[2 of 2 positions shown; findings below may reference images not displayed]

FINDINGS: The heart size and mediastinal contours are within normal limits.
Both lungs are clear. The visualized skeletal structures are
unremarkable. Spinal cord stimulator thoracic spine unchanged in
position.
IMPRESSION: No active cardiopulmonary disease.

## 2022-09-16 DIAGNOSIS — M79644 Pain in right finger(s): Secondary | ICD-10-CM | POA: Diagnosis not present

## 2022-09-16 DIAGNOSIS — M19041 Primary osteoarthritis, right hand: Secondary | ICD-10-CM | POA: Diagnosis not present

## 2022-11-03 NOTE — Progress Notes (Deleted)
Assessment/Plan:    1.  Dystonic tremor  -We will continue with low-dose primidone, 50 mg daily.  This has surprisingly helped.  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  This really is not bothering him.    Subjective:   Logan Shepherd was seen today in follow up for dystonic tremor.  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  Patient is overall doing well with the primidone.  He denies any side effects.  Current prescribed movement disorder medications: Primidone 50 mg daily   PREVIOUS MEDICATIONS: propranolol (was on it in 2015 for several years and it helped, but apparently his nephrologist did not want him on it)  ALLERGIES:   Allergies  Allergen Reactions   Codeine Nausea Only   Keflex [Cephalexin] Nausea Only   Penicillins Swelling   Nubain [Nalbuphine] Anxiety    CURRENT MEDICATIONS:  Outpatient Encounter Medications as of 11/08/2022  Medication Sig   albuterol (VENTOLIN HFA) 108 (90 Base) MCG/ACT inhaler Inhale 2 puffs into the lungs every 4 (four) hours as needed for wheezing or shortness of breath.   allopurinol (ZYLOPRIM) 100 MG tablet Take 100 mg by mouth daily.   atorvastatin (LIPITOR) 20 MG tablet 1 tablet   benzonatate (TESSALON) 100 MG capsule Take 1 capsule (100 mg total) by mouth 3 (three) times daily as needed.   colchicine 0.6 MG tablet Take 1 tablet (0.6 mg total) by mouth daily for 7 days.   cyclobenzaprine (FLEXERIL) 5 MG tablet 1 tablet as needed   doxycycline (VIBRA-TABS) 100 MG tablet Take 1 tablet (100 mg total) by mouth 2 (two) times daily.   fluticasone (FLONASE) 50 MCG/ACT nasal spray Place 2 sprays into both nostrils daily.   furosemide (LASIX) 20 MG tablet Take 1 tablet by mouth daily.   gabapentin (NEURONTIN) 300 MG capsule 1 capsule   levothyroxine (SYNTHROID) 50 MCG tablet TAKE 1 TABLET IN THE MORNING ON AN EMPTY STOMACH   lisinopril (ZESTRIL) 10 MG  tablet TAKE 1 TABLET EVERY DAY (PLEASE KEEP APPOINTMENT FOR 12/02/2019)   predniSONE (STERAPRED UNI-PAK 21 TAB) 10 MG (21) TBPK tablet Take by mouth daily. Take 6 tabs by mouth daily  for 1 days, then 5 tabs for 1 days, then 4 tabs for 1 days, then 3 tabs for 1 days, 2 tabs for 1 days, then 1 tab by mouth daily for 1 days   primidone (MYSOLINE) 50 MG tablet TAKE 1 TABLET AT BEDTIME   No facility-administered encounter medications on file as of 11/08/2022.     Objective:    PHYSICAL EXAMINATION:    VITALS:   There were no vitals filed for this visit.   Gen:  Appears stated age and in NAD. HEENT:  Normocephalic, atraumatic. The mucous membranes are moist.  Neck: There are no carotid bruits noted bilaterally.  Neck is turned to the right and slightly SB to the left.  There is hypertrophy of the left SCM.  Head rotation is limited to the left.   NEUROLOGICAL:   Orientation:  The patient is alert and oriented x 3.   Cranial nerves: There is good facial symmetry. Extraocular muscles are intact and visual fields are full to confrontational testing. Speech is fluent and clear. Marland Kitchen Hearing is intact to conversational tone. Sensation: Sensation is intact to light touch touch throughout  Coordination:  The patient has no dysdiadichokinesia or dysmetria. Gait and Station: The patient is able to  ambulate without difficulty.  Motor: There is some mild decreased grasp of the left hand.  He has good flexion and extension of the left wrist.  He is able to extend the fingers, but there is some mild weakness there (some limited due to pain).   MOVEMENT EXAM: Tremor:  There is no postural tremor or intention tremor today.  No significant tremor.   The patient is  able to draw Archimedes spirals without significant difficulty.  There is no tremor at rest.  There is head tremor if distracted significantly. I have reviewed and interpreted the following labs independently   Chemistry      Component Value  Date/Time   NA 132 (L) 10/15/2018 2032   K 4.8 10/15/2018 2032   CL 96 10/15/2018 2032   CO2 20 10/15/2018 2032   BUN 55 (H) 10/15/2018 2032   CREATININE 3.16 (H) 10/15/2018 2032      Component Value Date/Time   CALCIUM 9.9 10/15/2018 2032      No results found for: "WBC", "HGB", "HCT", "MCV", "PLT" No results found for: "TSH"   Chemistry      Component Value Date/Time   NA 132 (L) 10/15/2018 2032   K 4.8 10/15/2018 2032   CL 96 10/15/2018 2032   CO2 20 10/15/2018 2032   BUN 55 (H) 10/15/2018 2032   CREATININE 3.16 (H) 10/15/2018 2032      Component Value Date/Time   CALCIUM 9.9 10/15/2018 2032        Cc:  Darrow Bussing, MD

## 2022-11-08 ENCOUNTER — Encounter: Payer: Self-pay | Admitting: Neurology

## 2022-11-08 ENCOUNTER — Ambulatory Visit: Payer: Medicare (Managed Care) | Admitting: Neurology

## 2022-11-11 ENCOUNTER — Ambulatory Visit (HOSPITAL_COMMUNITY): Payer: Self-pay

## 2022-11-15 DIAGNOSIS — J0140 Acute pansinusitis, unspecified: Secondary | ICD-10-CM | POA: Diagnosis not present

## 2023-01-12 ENCOUNTER — Other Ambulatory Visit: Payer: Self-pay | Admitting: Neurology

## 2023-01-12 DIAGNOSIS — G243 Spasmodic torticollis: Secondary | ICD-10-CM

## 2023-01-12 DIAGNOSIS — R251 Tremor, unspecified: Secondary | ICD-10-CM

## 2023-01-27 DIAGNOSIS — J0181 Other acute recurrent sinusitis: Secondary | ICD-10-CM | POA: Diagnosis not present

## 2023-01-27 DIAGNOSIS — L089 Local infection of the skin and subcutaneous tissue, unspecified: Secondary | ICD-10-CM | POA: Diagnosis not present

## 2023-02-20 DIAGNOSIS — Z860101 Personal history of adenomatous and serrated colon polyps: Secondary | ICD-10-CM | POA: Diagnosis not present

## 2023-02-20 DIAGNOSIS — D123 Benign neoplasm of transverse colon: Secondary | ICD-10-CM | POA: Diagnosis not present

## 2023-02-20 DIAGNOSIS — Z09 Encounter for follow-up examination after completed treatment for conditions other than malignant neoplasm: Secondary | ICD-10-CM | POA: Diagnosis not present

## 2023-02-20 DIAGNOSIS — K5730 Diverticulosis of large intestine without perforation or abscess without bleeding: Secondary | ICD-10-CM | POA: Diagnosis not present

## 2023-02-20 DIAGNOSIS — K648 Other hemorrhoids: Secondary | ICD-10-CM | POA: Diagnosis not present

## 2023-04-09 ENCOUNTER — Other Ambulatory Visit: Payer: Self-pay | Admitting: Neurology

## 2023-04-09 DIAGNOSIS — G243 Spasmodic torticollis: Secondary | ICD-10-CM

## 2023-04-09 DIAGNOSIS — R251 Tremor, unspecified: Secondary | ICD-10-CM

## 2023-04-10 NOTE — Progress Notes (Unsigned)
 Assessment/Plan:    1.  Dystonic tremor  -We will continue with low-dose primidone, 50 mg daily.  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  This really is not bothering him.  2.  Left hand pain  -We were going to do an EMG, but I did see that he is following with orthopedics and they are doing an MRI and then going to consider EMG.  He can let us know if he needs this in the future.  3.  Follow-up 1 year.  Subjective:   Logan Shepherd was seen today in follow up for dystonic tremor.  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  I last saw the patient at the end of 2023.  He is currently on low-dose primidone and seems to be tolerating it well.  He thinks that it is working.  Current prescribed movement disorder medications: Primidone 50 mg daily   PREVIOUS MEDICATIONS: propranolol (was on it in 2015 for several years and it helped, but apparently his nephrologist did not want him on it)  ALLERGIES:   Allergies  Allergen Reactions   Codeine Nausea Only   Keflex [Cephalexin] Nausea Only   Penicillins Swelling   Nubain [Nalbuphine] Anxiety    CURRENT MEDICATIONS:  Outpatient Encounter Medications as of 04/11/2023  Medication Sig   albuterol (VENTOLIN HFA) 108 (90 Base) MCG/ACT inhaler Inhale 2 puffs into the lungs every 4 (four) hours as needed for wheezing or shortness of breath.   allopurinol (ZYLOPRIM) 100 MG tablet Take 100 mg by mouth daily.   atorvastatin (LIPITOR) 20 MG tablet 1 tablet   benzonatate (TESSALON) 100 MG capsule Take 1 capsule (100 mg total) by mouth 3 (three) times daily as needed.   colchicine 0.6 MG tablet Take 1 tablet (0.6 mg total) by mouth daily for 7 days.   cyclobenzaprine (FLEXERIL) 5 MG tablet 1 tablet as needed   doxycycline (VIBRA-TABS) 100 MG tablet Take 1 tablet (100 mg total) by mouth 2 (two) times daily.   fluticasone (FLONASE) 50 MCG/ACT nasal spray Place  2 sprays into both nostrils daily.   furosemide (LASIX) 20 MG tablet Take 1 tablet by mouth daily.   gabapentin (NEURONTIN) 300 MG capsule 1 capsule   levothyroxine (SYNTHROID) 50 MCG tablet TAKE 1 TABLET IN THE MORNING ON AN EMPTY STOMACH   lisinopril (ZESTRIL) 10 MG tablet TAKE 1 TABLET EVERY DAY (PLEASE KEEP APPOINTMENT FOR 12/02/2019)   predniSONE (STERAPRED UNI-PAK 21 TAB) 10 MG (21) TBPK tablet Take by mouth daily. Take 6 tabs by mouth daily  for 1 days, then 5 tabs for 1 days, then 4 tabs for 1 days, then 3 tabs for 1 days, 2 tabs for 1 days, then 1 tab by mouth daily for 1 days   primidone (MYSOLINE) 50 MG tablet TAKE 1 TABLET AT BEDTIME   No facility-administered encounter medications on file as of 04/11/2023.     Objective:    PHYSICAL EXAMINATION:    VITALS:   There were no vitals filed for this visit.   Gen:  Appears stated age and in NAD. HEENT:  Normocephalic, atraumatic. The mucous membranes are moist.  Neck: There are no carotid bruits noted bilaterally.  Neck is turned to the right and slightly SB to the left.  There is hypertrophy of the left SCM.  Head rotation is limited to the left.   NEUROLOGICAL:   Orientation:  The patient is  alert and oriented x 3.   Cranial nerves: There is good facial symmetry. Extraocular muscles are intact and visual fields are full to confrontational testing. Speech is fluent and clear. Marland Kitchen Hearing is intact to conversational tone. Sensation: Sensation is intact to light touch touch throughout  Coordination:  The patient has no dysdiadichokinesia or dysmetria. Gait and Station: The patient is able to ambulate without difficulty.  Motor: There is some mild decreased grasp of the left hand.  He has good flexion and extension of the left wrist.  He is able to extend the fingers, but there is some mild weakness there (some limited due to pain).   MOVEMENT EXAM: Tremor:  There is no postural tremor or intention tremor today.  No significant  tremor.   The patient is  able to draw Archimedes spirals without significant difficulty.  There is no tremor at rest.  There is head tremor if distracted significantly. I have reviewed and interpreted the following labs independently   Chemistry      Component Value Date/Time   NA 132 (L) 10/15/2018 2032   K 4.8 10/15/2018 2032   CL 96 10/15/2018 2032   CO2 20 10/15/2018 2032   BUN 55 (H) 10/15/2018 2032   CREATININE 3.16 (H) 10/15/2018 2032      Component Value Date/Time   CALCIUM 9.9 10/15/2018 2032      No results found for: "WBC", "HGB", "HCT", "MCV", "PLT" No results found for: "TSH"   Chemistry      Component Value Date/Time   NA 132 (L) 10/15/2018 2032   K 4.8 10/15/2018 2032   CL 96 10/15/2018 2032   CO2 20 10/15/2018 2032   BUN 55 (H) 10/15/2018 2032   CREATININE 3.16 (H) 10/15/2018 2032      Component Value Date/Time   CALCIUM 9.9 10/15/2018 2032      Total time spent on today's visit was *** minutes, including both face-to-face time and nonface-to-face time.  Time included that spent on review of records (prior notes available to me/labs/imaging if pertinent), discussing treatment and goals, answering patient's questions and coordinating care.   Cc:  Darrow Bussing, MD

## 2023-04-11 ENCOUNTER — Ambulatory Visit: Payer: Medicare HMO | Admitting: Neurology

## 2023-04-11 VITALS — BP 140/70 | HR 68 | Wt 264.2 lb

## 2023-04-11 DIAGNOSIS — G243 Spasmodic torticollis: Secondary | ICD-10-CM | POA: Diagnosis not present

## 2023-04-11 DIAGNOSIS — R251 Tremor, unspecified: Secondary | ICD-10-CM

## 2023-05-30 ENCOUNTER — Other Ambulatory Visit: Payer: Self-pay

## 2023-05-30 DIAGNOSIS — G243 Spasmodic torticollis: Secondary | ICD-10-CM

## 2023-05-30 DIAGNOSIS — R251 Tremor, unspecified: Secondary | ICD-10-CM

## 2023-05-30 MED ORDER — PRIMIDONE 50 MG PO TABS: 50.0000 mg | ORAL_TABLET | Freq: Every day | ORAL | 0 refills | Status: DC

## 2023-06-12 DIAGNOSIS — R059 Cough, unspecified: Secondary | ICD-10-CM | POA: Diagnosis not present

## 2023-07-10 ENCOUNTER — Other Ambulatory Visit: Payer: Self-pay | Admitting: Neurology

## 2023-07-10 DIAGNOSIS — R251 Tremor, unspecified: Secondary | ICD-10-CM

## 2023-07-10 DIAGNOSIS — G243 Spasmodic torticollis: Secondary | ICD-10-CM

## 2023-08-16 DIAGNOSIS — R079 Chest pain, unspecified: Secondary | ICD-10-CM | POA: Diagnosis not present

## 2023-08-16 DIAGNOSIS — M25561 Pain in right knee: Secondary | ICD-10-CM | POA: Diagnosis not present

## 2023-10-15 ENCOUNTER — Emergency Department (HOSPITAL_COMMUNITY)

## 2023-10-15 ENCOUNTER — Encounter (HOSPITAL_COMMUNITY): Payer: Self-pay

## 2023-10-15 ENCOUNTER — Other Ambulatory Visit: Payer: Self-pay

## 2023-10-15 ENCOUNTER — Emergency Department (HOSPITAL_COMMUNITY)
Admission: EM | Admit: 2023-10-15 | Discharge: 2023-10-15 | Disposition: A | Discharge status: Home / Self Care | Attending: Emergency Medicine | Admitting: Emergency Medicine

## 2023-10-15 DIAGNOSIS — N3001 Acute cystitis with hematuria: Secondary | ICD-10-CM | POA: Insufficient documentation

## 2023-10-15 DIAGNOSIS — N189 Chronic kidney disease, unspecified: Secondary | ICD-10-CM | POA: Diagnosis not present

## 2023-10-15 DIAGNOSIS — K5730 Diverticulosis of large intestine without perforation or abscess without bleeding: Secondary | ICD-10-CM | POA: Diagnosis not present

## 2023-10-15 DIAGNOSIS — R319 Hematuria, unspecified: Secondary | ICD-10-CM | POA: Diagnosis present

## 2023-10-15 DIAGNOSIS — I129 Hypertensive chronic kidney disease with stage 1 through stage 4 chronic kidney disease, or unspecified chronic kidney disease: Secondary | ICD-10-CM | POA: Diagnosis not present

## 2023-10-15 DIAGNOSIS — R059 Cough, unspecified: Secondary | ICD-10-CM | POA: Diagnosis not present

## 2023-10-15 DIAGNOSIS — N390 Urinary tract infection, site not specified: Secondary | ICD-10-CM | POA: Diagnosis not present

## 2023-10-15 DIAGNOSIS — K8020 Calculus of gallbladder without cholecystitis without obstruction: Secondary | ICD-10-CM | POA: Diagnosis not present

## 2023-10-15 DIAGNOSIS — K429 Umbilical hernia without obstruction or gangrene: Secondary | ICD-10-CM | POA: Diagnosis not present

## 2023-10-15 DIAGNOSIS — Z79899 Other long term (current) drug therapy: Secondary | ICD-10-CM | POA: Insufficient documentation

## 2023-10-15 DIAGNOSIS — R5383 Other fatigue: Secondary | ICD-10-CM | POA: Diagnosis not present

## 2023-10-15 DIAGNOSIS — N200 Calculus of kidney: Secondary | ICD-10-CM | POA: Diagnosis not present

## 2023-10-15 LAB — BASIC METABOLIC PANEL WITH GFR
Anion gap: 10 (ref 5–15)
BUN: 21 mg/dL (ref 8–23)
CO2: 23 mmol/L (ref 22–32)
Calcium: 9.5 mg/dL (ref 8.9–10.3)
Chloride: 101 mmol/L (ref 98–111)
Creatinine, Ser: 1.28 mg/dL — ABNORMAL HIGH (ref 0.61–1.24)
GFR, Estimated: 59 mL/min — ABNORMAL LOW (ref >60)
Glucose, Bld: 201 mg/dL — ABNORMAL HIGH (ref 70–99)
Potassium: 3.9 mmol/L (ref 3.5–5.1)
Sodium: 134 mmol/L — ABNORMAL LOW (ref 135–145)

## 2023-10-15 LAB — URINALYSIS, W/ REFLEX TO CULTURE (INFECTION SUSPECTED)
Bilirubin Urine: NEGATIVE
Glucose, UA: NEGATIVE mg/dL
Ketones, ur: NEGATIVE mg/dL
Nitrite: POSITIVE — AB
Protein, ur: NEGATIVE mg/dL
RBC / HPF: >50 RBC/hpf (ref 0–5)
Specific Gravity, Urine: 1.011 (ref 1.005–1.030)
WBC, UA: >50 WBC/hpf (ref 0–5)
pH: 5 (ref 5.0–8.0)

## 2023-10-15 LAB — CBC
HCT: 37.9 % — ABNORMAL LOW (ref 39.0–52.0)
Hemoglobin: 12.5 g/dL — ABNORMAL LOW (ref 13.0–17.0)
MCH: 32.3 pg (ref 26.0–34.0)
MCHC: 33 g/dL (ref 30.0–36.0)
MCV: 97.9 fL (ref 80.0–100.0)
Platelets: 119 K/uL — ABNORMAL LOW (ref 150–400)
RBC: 3.87 MIL/uL — ABNORMAL LOW (ref 4.22–5.81)
RDW: 13.7 % (ref 11.5–15.5)
WBC: 12.2 K/uL — ABNORMAL HIGH (ref 4.0–10.5)
nRBC: 0 % (ref 0.0–0.2)

## 2023-10-15 MED ORDER — ONDANSETRON 4 MG PO TBDP
4.0000 mg | ORAL_TABLET | Freq: Once | ORAL | Status: AC
  Administered 2023-10-15: 4 mg via ORAL
  Filled 2023-10-15: qty 1

## 2023-10-15 MED ORDER — ONDANSETRON 8 MG PO TBDP: 8.0000 mg | ORAL_TABLET | Freq: Three times a day (TID) | ORAL | 0 refills | Status: AC | PRN

## 2023-10-15 MED ORDER — OXYCODONE-ACETAMINOPHEN 5-325 MG PO TABS: 1.0000 | ORAL_TABLET | Freq: Three times a day (TID) | ORAL | 0 refills | Status: DC | PRN

## 2023-10-15 MED ORDER — OXYCODONE-ACETAMINOPHEN 5-325 MG PO TABS
1.0000 | ORAL_TABLET | Freq: Once | ORAL | Status: AC
  Administered 2023-10-15: 1 via ORAL
  Filled 2023-10-15: qty 1

## 2023-10-15 MED ORDER — SULFAMETHOXAZOLE-TRIMETHOPRIM 800-160 MG PO TABS
1.0000 | ORAL_TABLET | Freq: Once | ORAL | Status: AC
  Administered 2023-10-15: 1 via ORAL
  Filled 2023-10-15: qty 1

## 2023-10-15 MED ORDER — ACETAMINOPHEN ER 650 MG PO TBCR: 650.0000 mg | EXTENDED_RELEASE_TABLET | Freq: Four times a day (QID) | ORAL | 0 refills | Status: DC

## 2023-10-15 MED ORDER — SULFAMETHOXAZOLE-TRIMETHOPRIM 800-160 MG PO TABS: 1.0000 | ORAL_TABLET | Freq: Two times a day (BID) | ORAL | 0 refills | Status: AC

## 2023-10-15 MED ORDER — HYDROMORPHONE HCL 1 MG/ML IJ SOLN
0.5000 mg | Freq: Once | INTRAMUSCULAR | Status: AC
  Administered 2023-10-15: 0.5 mg via INTRAMUSCULAR
  Filled 2023-10-15: qty 1

## 2023-10-15 NOTE — ED Notes (Signed)
 Patient transported to CT

## 2023-10-15 NOTE — ED Triage Notes (Signed)
 Pt states he noticed his urine is brown and blood in his underwear. His is having pain when he urinated, this all started yesterday. Pt has been also having a cough and head congestion.

## 2023-10-15 NOTE — Discharge Instructions (Addendum)
 We saw you in the ER for the abdominal pain, urinary discomfort and change in urine. Our results indicate that you have a kidney stone that is still embedded in your kidney and not causing any obstruction.  Renal function is normal.  Urine analysis shows signs of blood and infection.  Please call urology in the morning make a follow-up appointment with them as soon as they are available.  You can take your Zofran  as needed for nausea and vomiting.  Take your pain meds as needed for pain control and take your antibiotics as prescribed.  If your symptoms worsen return to the emergency department.  We were able to get your pain is relative control, and we can safely send you home.  Take the meds prescribed. Set up an appointment with the Urologist. If the pain is unbearable, you start having fevers, chills, and are unable to keep any meds down - then return to the ER.

## 2023-10-15 NOTE — ED Provider Notes (Signed)
 I took over care of this patient at 4 PM.  Physical Exam  BP 126/73 (BP Location: Right Arm)   Pulse 94   Temp 98.2 F (36.8 C) (Oral)   Resp 16   SpO2 95%   Physical Exam Vitals and nursing note reviewed.  Constitutional:      General: He is not in acute distress.    Appearance: He is well-developed.  HENT:     Head: Normocephalic and atraumatic.  Eyes:     Conjunctiva/sclera: Conjunctivae normal.  Cardiovascular:     Rate and Rhythm: Normal rate and regular rhythm.     Heart sounds: No murmur heard. Pulmonary:     Effort: Pulmonary effort is normal. No respiratory distress.     Breath sounds: Normal breath sounds.  Abdominal:     Palpations: Abdomen is soft.     Tenderness: There is no abdominal tenderness.  Musculoskeletal:        General: No swelling.     Cervical back: Neck supple.  Skin:    General: Skin is warm and dry.     Capillary Refill: Capillary refill takes less than 2 seconds.  Neurological:     Mental Status: He is alert.  Psychiatric:        Mood and Affect: Mood normal.     Procedures  Procedures  ED Course / MDM   Clinical Course as of 10/15/23 1946  Sun Oct 15, 2023  1612 Patient's urine analysis reveals positive nitrites, bacteria, leukocytes and nitrites. I reviewed patient's previous urine susceptibilities. Patient is also allergic to penicillin and cephalosporin.  We will start him on Bactrim .  Patient CT scan shows 11 mm renal stone.  This finding has been discussed with the patient.  He has just received pain medicine and Bactrim .  He is tolerating p.o. at this time.  P.o. challenge also initiated.  If patient passes oral challenge, then he can be discharged. Otherwise patient will need admission to the hospital and urology consult.  Patient's care has been signed out to incoming team. [AN]    Clinical Course User Index [AN] Charlyn Sora, MD   Medical Decision Making On reevaluation patient's blood pressure is in the 100s, but  he is laying on his side.  He has received oxycodone  and Bactrim  and states he is feeling better.  I recommended admission for pain control and IV fluids but he declined would like to go home and follow-up with urology.  Was sent home with prescriptions for pain and urinary tract infection.  Given very strict return precautions and patient and wife will plan to come back should he have any new or worsening symptoms.  Problems Addressed: Acute cystitis with hematuria: acute illness or injury Nephrolithiasis: acute illness or injury  Amount and/or Complexity of Data Reviewed Labs: ordered. Decision-making details documented in ED Course. Radiology: ordered and independent interpretation performed. Decision-making details documented in ED Course.  Risk OTC drugs. Prescription drug management. Diagnosis or treatment significantly limited by social determinants of health.          Gennaro Duwaine CROME, DO 10/15/23 1946

## 2023-10-15 NOTE — ED Notes (Signed)
Attempted PIV insertion x2, unsuccessful.

## 2023-10-15 NOTE — ED Provider Notes (Signed)
 [place] EMERGENCY DEPARTMENT AT [facility] Provider Note   CSN: [id number] Arrival date & time: 10/15/23  1210     Patient presents with: Hematuria   Logan Shepherd is a 73 y.o. male.  {Add pertinent medical, surgical, social history, OB history to YEP:67052} HPI    Patient comes in with chief complaint of abdominal pain. Patient has history of hypertension, chronic kidney disease and previous history of kidney stones.  He states that he has been having some off-and-on abdominal discomfort over the last few days.  Yesterday however, the pain was more intense.  For about 3 hours, his pain was excruciating.  Pain is left-sided.  Additionally, he also noted his urine was brown in color.  Patient has some urinary discomfort.  This morning when he changed his clothes, he also noted that his underwear was soiled with blood.  Review of system is negative for any fevers, chills. Patient's last kidney stone was more than 6 years ago.  He denies any requirement for surgical intervention for stones in the past.  Prior to Admission medications   Medication Sig Start Date End Date Taking? Authorizing Provider  acetaminophen  (TYLENOL  8 HOUR) 650 MG CR tablet Take 1 tablet (650 mg total) by mouth every 6 (six) hours. 10/15/23  Yes Charlyn Sora, MD  ondansetron  (ZOFRAN -ODT) 8 MG disintegrating tablet Take 1 tablet (8 mg total) by mouth every 8 (eight) hours as needed for nausea. 10/15/23  Yes Charlyn Sora, MD  oxyCODONE -acetaminophen  (PERCOCET/ROXICET) 5-325 MG tablet Take 1 tablet by mouth every 8 (eight) hours as needed for severe pain (pain score 7-10). 10/15/23  Yes Jayd Forrey, MD  sulfamethoxazole -trimethoprim  (BACTRIM  DS) 800-160 MG tablet Take 1 tablet by mouth 2 (two) times daily for 7 days. 10/15/23 10/22/23 Yes Charlyn Sora, MD  albuterol  (VENTOLIN  HFA) 108 (90 Base) MCG/ACT inhaler Inhale 2 puffs into the lungs every 4 (four) hours as needed for wheezing or shortness of  breath. 05/31/21   Vear, Amy L, PA  allopurinol (ZYLOPRIM) 100 MG tablet Take 100 mg by mouth daily. 09/24/21   [provider]  atorvastatin (LIPITOR) 20 MG tablet 1 tablet    [provider]  benzonatate  (TESSALON ) 100 MG capsule Take 1 capsule (100 mg total) by mouth 3 (three) times daily as needed. 02/18/22   Vivienne Delon HERO, PA-C  colchicine  0.6 MG tablet Take 1 tablet (0.6 mg total) by mouth daily for 7 days. 02/12/21 02/19/21  Billy Asberry FALCON, PA-C  cyclobenzaprine (FLEXERIL) 5 MG tablet 1 tablet as needed 04/10/20   [provider]  doxycycline  (VIBRA -TABS) 100 MG tablet Take 1 tablet (100 mg total) by mouth 2 (two) times daily. 02/18/22   Vivienne Delon HERO, PA-C  fluticasone  (FLONASE ) 50 MCG/ACT nasal spray Place 2 sprays into both nostrils daily. 02/18/22   Vivienne Delon HERO, PA-C  furosemide (LASIX) 20 MG tablet Take 1 tablet by mouth daily.    [provider]  gabapentin (NEURONTIN) 300 MG capsule 1 capsule 04/17/19   [provider]  levothyroxine (SYNTHROID) 50 MCG tablet TAKE 1 TABLET IN THE MORNING ON AN EMPTY STOMACH    [provider]  lisinopril (ZESTRIL) 10 MG tablet TAKE 1 TABLET EVERY DAY (PLEASE KEEP APPOINTMENT FOR 12/02/2019)    [provider]  predniSONE  (STERAPRED UNI-PAK 21 TAB) 10 MG (21) TBPK tablet Take by mouth daily. Take 6 tabs by mouth daily  for 1 days, then 5 tabs for 1 days, then 4 tabs for 1  days, then 3 tabs for 1 days, 2 tabs for 1 days, then 1 tab by mouth daily for 1 days 07/16/22   Teresa Shelba SAUNDERS, NP  primidone  (MYSOLINE ) 50 MG tablet TAKE 1 TABLET AT BEDTIME 07/11/23   Tat, Asberry RAMAN, DO    Allergies: Codeine, Keflex [cephalexin], Penicillins, and Nubain [nalbuphine]    Review of Systems  All other systems reviewed and are negative.   Updated Vital Signs BP (!) 96/50 (BP Location: Right Arm)   Pulse 83   Temp 99 F (37.2 C) (Oral)   Resp 18   SpO2 94%   Physical  Exam Vitals and nursing note reviewed.  Constitutional:      Appearance: He is well-developed.  HENT:     Head: Atraumatic.  Eyes:     Extraocular Movements: Extraocular movements intact.     Pupils: Pupils are equal, round, and reactive to light.  Cardiovascular:     Rate and Rhythm: Normal rate.  Pulmonary:     Effort: Pulmonary effort is normal.  Abdominal:     Palpations: Abdomen is soft.     Tenderness: There is abdominal tenderness. There is no guarding or rebound.  Musculoskeletal:     Cervical back: Neck supple.  Skin:    General: Skin is warm.  Neurological:     Mental Status: He is alert and oriented to person, place, and time.     (all labs ordered are listed, but only abnormal results are displayed) Labs Reviewed  BASIC METABOLIC PANEL WITH GFR - Abnormal; Notable for the following components:      Result Value   Sodium 134 (*)    Glucose, Bld 201 (*)    Creatinine, Ser 1.28 (*)    GFR, Estimated 59 (*)    All other components within normal limits  CBC - Abnormal; Notable for the following components:   WBC 12.2 (*)    RBC 3.87 (*)    Hemoglobin 12.5 (*)    HCT 37.9 (*)    Platelets 119 (*)    All other components within normal limits  URINALYSIS, W/ REFLEX TO CULTURE (INFECTION SUSPECTED) - Abnormal; Notable for the following components:   APPearance HAZY (*)    Hgb urine dipstick LARGE (*)    Nitrite POSITIVE (*)    Leukocytes,Ua MODERATE (*)    Bacteria, UA MANY (*)    All other components within normal limits  URINE CULTURE    EKG: None  Radiology: CT Renal Stone Study Result Date: 10/15/2023 CLINICAL DATA:  Flank pain.  Hematuria.  Nephrolithiasis. EXAM: CT ABDOMEN AND PELVIS WITHOUT CONTRAST TECHNIQUE: Multidetector CT imaging of the abdomen and pelvis was performed following the standard protocol without IV contrast. RADIATION DOSE REDUCTION: This exam was performed according to the departmental dose-optimization program which includes  automated exposure control, adjustment of the mA and/or kV according to patient size and/or use of iterative reconstruction technique. COMPARISON:  None Available. FINDINGS: Lower chest: No acute findings. Hepatobiliary: No mass visualized on this unenhanced exam. Tiny calcified gallstone noted, without signs of cholecystitis or biliary ductal dilatation. Pancreas: No mass or inflammatory process visualized on this unenhanced exam. Spleen:  Within normal limits in size. Adrenals/Urinary tract: Mild renal atrophy is seen, left side greater than right. 11 mm calculus seen in lower pole of left kidney. No evidence of ureteral calculi or hydronephrosis. Unremarkable unopacified urinary bladder. Stomach/Bowel: No evidence of obstruction, inflammatory process, or abnormal fluid collections. Normal appendix visualized. Diverticulosis is seen mainly  involving the sigmoid colon, however there is no evidence of diverticulitis. Vascular/Lymphatic: No pathologically enlarged lymph nodes identified. No evidence of abdominal aortic aneurysm. Reproductive:  Mildly enlarged prostate. Other: Small umbilical hernia, which contains only fat. Small fat-containing left inguinal hernia also noted. Musculoskeletal:  No suspicious bone lesions identified. IMPRESSION: 11 mm left renal calculus. No evidence of ureteral calculi, hydronephrosis, or other acute findings. Mildly enlarged prostate. Cholelithiasis. No radiographic evidence of cholecystitis. Colonic diverticulosis, without radiographic evidence of diverticulitis. Small umbilical and left inguinal hernias, which contain only fat. Electronically Signed   By: Norleen DELENA Kil M.D.   On: 10/15/2023 15:34    {Document cardiac monitor, telemetry assessment procedure when appropriate:32947} Procedures   Medications Ordered in the ED  oxyCODONE -acetaminophen  (PERCOCET/ROXICET) 5-325 MG per tablet 1 tablet (1 tablet Oral Given 10/15/23 1523)  sulfamethoxazole -trimethoprim  (BACTRIM  DS)  800-160 MG per tablet 1 tablet (1 tablet Oral Given 10/15/23 1523)    Clinical Course as of 10/15/23 1616  Sun Oct 15, 2023  1612 Patient's urine analysis reveals positive nitrites, bacteria, leukocytes and nitrites. I reviewed patient's previous urine susceptibilities. Patient is also allergic to penicillin and cephalosporin.  We will start him on Bactrim .  Patient CT scan shows 11 mm renal stone.  This finding has been discussed with the patient.  He has just received pain medicine and Bactrim .  He is tolerating p.o. at this time.  P.o. challenge also initiated.  If patient passes oral challenge, then he can be discharged. Otherwise patient will need admission to the hospital and urology consult.  Patient's care has been signed out to incoming team. [AN]    Clinical Course User Index [AN] Charlyn Sora, MD   {Click here for ABCD2, HEART and other calculators REFRESH Note before signing:1}                              Medical Decision Making Amount and/or Complexity of Data Reviewed Labs: ordered. Radiology: ordered.  Risk OTC drugs. Prescription drug management.   73 year old male with history of hypertension, kidney stones and chronic kidney disease comes in with chief complaint of abdominal pain, urinary changes.  Based on initial assessment, differential diagnosis includes nephrolithiasis, ureteral colic, acute cystitis, pyelonephritis, bladder mass, renal mass.  Patient is not on any blood thinners. Bedside urine actually looks dark yellow, no clots noted.  Plan is to get basic labs, urine analysis with culture and also CT renal stone abdominal protocol.    Final diagnoses:  Acute cystitis with hematuria  Nephrolithiasis    ED Discharge Orders          Ordered    sulfamethoxazole -trimethoprim  (BACTRIM  DS) 800-160 MG tablet  2 times daily        10/15/23 1612    oxyCODONE -acetaminophen  (PERCOCET/ROXICET) 5-325 MG tablet  Every 8 hours PRN        10/15/23  1612    acetaminophen  (TYLENOL  8 HOUR) 650 MG CR tablet  Every 6 hours        10/15/23 1612    ondansetron  (ZOFRAN -ODT) 8 MG disintegrating tablet  Every 8 hours PRN        10/15/23 1612

## 2023-10-15 NOTE — ED Notes (Signed)
 Pt reports prior hx kidney stones and says his pain last night, which lasted about 3 hours, felt similar. No pain at this time.

## 2023-10-17 DIAGNOSIS — N3020 Other chronic cystitis without hematuria: Secondary | ICD-10-CM | POA: Diagnosis not present

## 2023-10-17 DIAGNOSIS — N200 Calculus of kidney: Secondary | ICD-10-CM | POA: Diagnosis not present

## 2023-10-17 LAB — URINE CULTURE: Culture: >=100000 — AB

## 2023-10-18 ENCOUNTER — Telehealth (HOSPITAL_BASED_OUTPATIENT_CLINIC_OR_DEPARTMENT_OTHER): Payer: Self-pay

## 2023-10-18 NOTE — Telephone Encounter (Signed)
 Post ED Visit - Positive Culture Follow-up  Culture report reviewed by antimicrobial stewardship pharmacist: [facility] []  Rankin Dee, Pharm.D. []  Venetia Gully, [address].D., BCPS AQ-ID []  Garrel Crews, Pharm.D., BCPS []  Almarie Lunger, [address].D., BCPS []  [place], [address].D., BCPS, AAHIVP []  Rosaline Bihari, Pharm.D., BCPS, AAHIVP []  Vernell Meier, PharmD, BCPS []  Latanya Hint, PharmD, BCPS []  Donald Medley, PharmD, BCPS []  Rocky Bold, PharmD []  Dorothyann Alert, PharmD, BCPS []  Morene Babe, PharmD  [facility] [x]  Izetta Carl, PharmD []  Romona Bliss, PharmD []  Dolphus Roller, PharmD []  Veva Seip, Rph []  Vernell Daunt) Leonce, PharmD []  Eva Allis, PharmD []  Rosaline Millet, PharmD []  Iantha Batch, PharmD []  Arvin Gauss, PharmD []  Wanda Hasting, PharmD []  Ronal Rav, PharmD []  Rocky Slade, PharmD []  Bard Jeans, PharmD   Positive urine culture Treated with Sulfamethoxazole , organism sensitive to the same and no further patient follow-up is required at this time.  Logan Shepherd 10/18/2023, 10:14 AM

## 2023-10-24 ENCOUNTER — Other Ambulatory Visit: Payer: Self-pay | Admitting: Neurology

## 2023-10-24 DIAGNOSIS — R251 Tremor, unspecified: Secondary | ICD-10-CM

## 2023-10-24 DIAGNOSIS — G243 Spasmodic torticollis: Secondary | ICD-10-CM

## 2023-10-24 NOTE — Progress Notes (Signed)
 Sent message, via epic in basket, requesting orders in epic from Careers adviser.

## 2023-10-25 NOTE — Progress Notes (Signed)
 Anesthesia Review:  PCP: Dibas Koirala  Cardiologist :  PPM/ ICD: Device Orders: Rep Notified:  Chest x-ray : EKG : Echo : Stress test: Cardiac Cath :   Activity level:  Sleep Study/ CPAP : Fasting Blood Sugar :      / Checks Blood Sugar -- times a day:    Blood Thinner/ Instructions /Last Dose: ASA / Instructions/ Last Dose :    10/15/23- cbc and BMP in epic and urine culture and U/A  10/15/23- In ED

## 2023-10-25 NOTE — Patient Instructions (Signed)
 SURGICAL WAITING ROOM VISITATION  Patients having surgery or a procedure may have no more than 2 support people in the waiting area - these visitors may rotate.    Children under the age of 71 must have an adult with them who is not the patient.  Visitors with respiratory illnesses are discouraged from visiting and should remain at home.  If the patient needs to stay at the hospital during part of their recovery, the visitor guidelines for inpatient rooms apply. Pre-op nurse will coordinate an appropriate time for 1 support person to accompany patient in pre-op.  This support person may not rotate.    Please refer to the [employer] website for the visitor guidelines for Inpatients (after your surgery is over and you are in a regular room).       Your procedure is scheduled on:  10/27/2023    Report to [facility] Main Entrance    Report to admitting at   100 pm    Call this number if you have problems the morning of surgery [phone]   Do not eat food  :After Midnight.   After Midnight you may have the following liquids until _ 1200 noon  DAY OF SURGERY  Water Non-Citrus Juices (without pulp, NO RED-Apple, White grape, White cranberry) Black Coffee (NO MILK/CREAM OR CREAMERS, sugar ok)  Clear Tea (NO MILK/CREAM OR CREAMERS, sugar ok) regular and decaf                             Plain Jell-O (NO RED)                                           Fruit ices (not with fruit pulp, NO RED)                                     Popsicles (NO RED)                                                               Sports drinks like Gatorade (NO RED)                             If you have questions, please contact your surgeon's office.       Oral Hygiene is also important to reduce your risk of infection.                                    Remember - BRUSH YOUR TEETH THE MORNING OF SURGERY WITH YOUR REGULAR TOOTHPASTE  DENTURES WILL BE REMOVED PRIOR TO SURGERY PLEASE DO NOT  APPLY Poly grip OR ADHESIVES!!!   Do NOT smoke after Midnight   Stop all vitamins and herbal supplements 7 days before surgery.   Take these medicines the morning of surgery with A SIP OF WATER:  inhalers as usual and bring, allopurinol, synthroid   DO NOT TAKE ANY ORAL DIABETIC MEDICATIONS  DAY OF YOUR SURGERY  Bring CPAP mask and tubing day of surgery.                              You may not have any metal on your body including hair pins, jewelry, and body piercing             Do not wear make-up, lotions, powders, perfumes/cologne, or deodorant  Do not wear nail polish including gel and S&S, artificial/acrylic nails, or any other type of covering on natural nails including finger and toenails. If you have artificial nails, gel coating, etc. that needs to be removed by a nail salon please have this removed prior to surgery or surgery may need to be canceled/ delayed if the surgeon/ anesthesia feels like they are unable to be safely monitored.   Do not shave  48 hours prior to surgery.               Men may shave face and neck.   Do not bring valuables to the hospital. [facility] IS NOT             RESPONSIBLE   FOR VALUABLES.   Contacts, glasses, dentures or bridgework may not be worn into surgery.   Bring small overnight bag day of surgery.   DO NOT BRING YOUR HOME MEDICATIONS TO THE HOSPITAL. PHARMACY WILL DISPENSE MEDICATIONS LISTED ON YOUR MEDICATION LIST TO YOU DURING YOUR ADMISSION IN THE HOSPITAL!    Patients discharged on the day of surgery will not be allowed to drive home.  Someone NEEDS to stay with you for the first 24 hours after anesthesia.   Special Instructions: Bring a copy of your healthcare power of attorney and living will documents the day of surgery if you haven't scanned them before.              Please read over the following fact sheets you were given: IF YOU HAVE QUESTIONS ABOUT YOUR PRE-OP INSTRUCTIONS PLEASE CALL [phone].   If you received a  COVID test during your pre-op visit  it is requested that you wear a mask when out in public, stay away from anyone that may not be feeling well and notify your surgeon if you develop symptoms. If you test positive for Covid or have been in contact with anyone that has tested positive in the last 10 days please notify you surgeon.    [facility] - Preparing for Surgery Before surgery, you can play an important role.  Because skin is not sterile, your skin needs to be as free of germs as possible.  You can reduce the number of germs on your skin by washing with CHG (chlorahexidine gluconate) soap before surgery.  CHG is an antiseptic cleaner which kills germs and bonds with the skin to continue killing germs even after washing. Please DO NOT use if you have an allergy to CHG or antibacterial soaps.  If your skin becomes reddened/irritated stop using the CHG and inform your nurse when you arrive at Short Stay. Do not shave (including legs and underarms) for at least 48 hours prior to the first CHG shower.  You may shave your face/neck. Please follow these instructions carefully:  1.  Shower with CHG Soap the night before surgery and the  morning of Surgery.  2.  If you choose to wash your hair, wash your hair first as usual with your  normal  shampoo.  3.  After you shampoo, rinse your hair and body thoroughly to remove the  shampoo.                           4.  Use CHG as you would any other liquid soap.  You can apply chg directly  to the skin and wash                       Gently with a scrungie or clean washcloth.  5.  Apply the CHG Soap to your body ONLY FROM THE NECK DOWN.   Do not use on face/ open                           Wound or open sores. Avoid contact with eyes, ears mouth and genitals (private parts).                       Wash face,  Genitals (private parts) with your normal soap.             6.  Wash thoroughly, paying special attention to the area where your surgery  will be  performed.  7.  Thoroughly rinse your body with warm water from the neck down.  8.  DO NOT shower/wash with your normal soap after using and rinsing off  the CHG Soap.                9.  Pat yourself dry with a clean towel.            10.  Wear clean pajamas.            11.  Place clean sheets on your bed the night of your first shower and do not  sleep with pets. Day of Surgery : Do not apply any lotions/deodorants the morning of surgery.  Please wear clean clothes to the hospital/surgery center.  FAILURE TO FOLLOW THESE INSTRUCTIONS MAY RESULT IN THE CANCELLATION OF YOUR SURGERY PATIENT SIGNATURE_________________________________  NURSE SIGNATURE__________________________________  ________________________________________________________________________

## 2023-10-26 ENCOUNTER — Encounter (HOSPITAL_COMMUNITY): Payer: Self-pay | Admitting: Urology

## 2023-10-26 ENCOUNTER — Encounter (HOSPITAL_COMMUNITY)
Admission: RE | Admit: 2023-10-26 | Discharge: 2023-10-26 | Disposition: A | Discharge status: Home / Self Care | Source: Ambulatory Visit | Attending: Urology | Admitting: Urology

## 2023-10-26 ENCOUNTER — Other Ambulatory Visit: Payer: Self-pay

## 2023-10-26 DIAGNOSIS — Z01818 Encounter for other preprocedural examination: Secondary | ICD-10-CM | POA: Diagnosis not present

## 2023-10-27 ENCOUNTER — Ambulatory Visit (HOSPITAL_COMMUNITY): Payer: Self-pay | Admitting: Anesthesiology

## 2023-10-27 ENCOUNTER — Encounter (HOSPITAL_COMMUNITY): Payer: Self-pay | Admitting: Anesthesiology

## 2023-10-27 ENCOUNTER — Encounter (HOSPITAL_COMMUNITY)
Admission: RE | Disposition: A | Discharge status: Home / Self Care | Payer: Self-pay | Source: Home / Self Care | Attending: Urology

## 2023-10-27 ENCOUNTER — Encounter (HOSPITAL_COMMUNITY): Payer: Self-pay | Admitting: Urology

## 2023-10-27 ENCOUNTER — Ambulatory Visit (HOSPITAL_COMMUNITY)

## 2023-10-27 ENCOUNTER — Ambulatory Visit (HOSPITAL_COMMUNITY)
Admission: RE | Admit: 2023-10-27 | Discharge: 2023-10-27 | Disposition: A | Discharge status: Home / Self Care | Attending: Urology | Admitting: Urology

## 2023-10-27 DIAGNOSIS — M109 Gout, unspecified: Secondary | ICD-10-CM | POA: Insufficient documentation

## 2023-10-27 DIAGNOSIS — E039 Hypothyroidism, unspecified: Secondary | ICD-10-CM | POA: Diagnosis not present

## 2023-10-27 DIAGNOSIS — N200 Calculus of kidney: Secondary | ICD-10-CM

## 2023-10-27 DIAGNOSIS — Z79899 Other long term (current) drug therapy: Secondary | ICD-10-CM | POA: Diagnosis not present

## 2023-10-27 DIAGNOSIS — N3090 Cystitis, unspecified without hematuria: Secondary | ICD-10-CM | POA: Diagnosis not present

## 2023-10-27 DIAGNOSIS — Z01818 Encounter for other preprocedural examination: Secondary | ICD-10-CM

## 2023-10-27 DIAGNOSIS — I10 Essential (primary) hypertension: Secondary | ICD-10-CM | POA: Insufficient documentation

## 2023-10-27 DIAGNOSIS — E669 Obesity, unspecified: Secondary | ICD-10-CM | POA: Diagnosis not present

## 2023-10-27 DIAGNOSIS — N3020 Other chronic cystitis without hematuria: Secondary | ICD-10-CM | POA: Diagnosis not present

## 2023-10-27 HISTORY — PX: CYSTOSCOPY W/ RETROGRADES: SHX1426

## 2023-10-27 HISTORY — DX: Prediabetes: R73.03

## 2023-10-27 HISTORY — DX: Personal history of urinary calculi: Z87.442

## 2023-10-27 HISTORY — DX: Unspecified osteoarthritis, unspecified site: M19.90

## 2023-10-27 HISTORY — PX: CYSTOSCOPY/URETEROSCOPY/HOLMIUM LASER/STENT PLACEMENT: SHX6546

## 2023-10-27 SURGERY — CYSTOSCOPY/URETEROSCOPY/HOLMIUM LASER/STENT PLACEMENT
Anesthesia: General | Site: Pelvis | Laterality: Left

## 2023-10-27 MED ORDER — FENTANYL CITRATE (PF) 100 MCG/2ML IJ SOLN
INTRAMUSCULAR | Status: DC | PRN
  Administered 2023-10-27 (×4): 50 ug via INTRAVENOUS

## 2023-10-27 MED ORDER — ONDANSETRON HCL 4 MG/2ML IJ SOLN
INTRAMUSCULAR | Status: DC | PRN
  Administered 2023-10-27: 4 mg via INTRAVENOUS

## 2023-10-27 MED ORDER — PHENYLEPHRINE 80 MCG/ML (10ML) SYRINGE FOR IV PUSH (FOR BLOOD PRESSURE SUPPORT)
PREFILLED_SYRINGE | INTRAVENOUS | Status: AC
  Filled 2023-10-27: qty 10

## 2023-10-27 MED ORDER — ACETAMINOPHEN 500 MG PO TABS
1000.0000 mg | ORAL_TABLET | Freq: Once | ORAL | Status: AC
  Administered 2023-10-27: 1000 mg via ORAL
  Filled 2023-10-27: qty 2

## 2023-10-27 MED ORDER — FENTANYL CITRATE PF 50 MCG/ML IJ SOSY: 25.0000 ug | PREFILLED_SYRINGE | INTRAMUSCULAR | Status: DC | PRN

## 2023-10-27 MED ORDER — EPHEDRINE 5 MG/ML INJ
INTRAVENOUS | Status: AC
  Filled 2023-10-27: qty 5

## 2023-10-27 MED ORDER — KETOROLAC TROMETHAMINE 30 MG/ML IJ SOLN
INTRAMUSCULAR | Status: AC
  Filled 2023-10-27: qty 1

## 2023-10-27 MED ORDER — OXYCODONE-ACETAMINOPHEN 5-325 MG PO TABS: 1.0000 | ORAL_TABLET | Freq: Three times a day (TID) | ORAL | 0 refills | Status: AC | PRN

## 2023-10-27 MED ORDER — CHLORHEXIDINE GLUCONATE 0.12 % MT SOLN
15.0000 mL | Freq: Once | OROMUCOSAL | Status: AC
  Administered 2023-10-27: 15 mL via OROMUCOSAL

## 2023-10-27 MED ORDER — ORAL CARE MOUTH RINSE
15.0000 mL | Freq: Once | OROMUCOSAL | Status: AC
Start: 2023-10-27 — End: 2023-10-27

## 2023-10-27 MED ORDER — ONDANSETRON HCL 4 MG/2ML IJ SOLN
INTRAMUSCULAR | Status: AC
  Filled 2023-10-27: qty 2

## 2023-10-27 MED ORDER — ONDANSETRON HCL 4 MG/2ML IJ SOLN: 4.0000 mg | Freq: Once | INTRAMUSCULAR | Status: DC | PRN

## 2023-10-27 MED ORDER — LACTATED RINGERS IV SOLN: INTRAVENOUS | Status: DC

## 2023-10-27 MED ORDER — DEXAMETHASONE SODIUM PHOSPHATE 10 MG/ML IJ SOLN
INTRAMUSCULAR | Status: AC
  Filled 2023-10-27: qty 1

## 2023-10-27 MED ORDER — FENTANYL CITRATE (PF) 100 MCG/2ML IJ SOLN
INTRAMUSCULAR | Status: AC
  Filled 2023-10-27: qty 2

## 2023-10-27 MED ORDER — PROPOFOL 10 MG/ML IV BOLUS
INTRAVENOUS | Status: DC | PRN
  Administered 2023-10-27: 200 mg via INTRAVENOUS
  Administered 2023-10-27: 100 ug/kg/min via INTRAVENOUS

## 2023-10-27 MED ORDER — KETOROLAC TROMETHAMINE 10 MG PO TABS: 10.0000 mg | ORAL_TABLET | Freq: Three times a day (TID) | ORAL | 0 refills | Status: AC | PRN

## 2023-10-27 MED ORDER — DEXAMETHASONE SODIUM PHOSPHATE 10 MG/ML IJ SOLN
INTRAMUSCULAR | Status: DC | PRN
Start: 2023-10-27 — End: 2023-10-27
  Administered 2023-10-27: 10 mg via INTRAVENOUS

## 2023-10-27 MED ORDER — SENNOSIDES-DOCUSATE SODIUM 8.6-50 MG PO TABS: 1.0000 | ORAL_TABLET | Freq: Two times a day (BID) | ORAL | 0 refills | Status: AC

## 2023-10-27 MED ORDER — LIDOCAINE HCL (PF) 2 % IJ SOLN
INTRAMUSCULAR | Status: AC
  Filled 2023-10-27: qty 5

## 2023-10-27 MED ORDER — PROPOFOL 10 MG/ML IV BOLUS
INTRAVENOUS | Status: AC
  Filled 2023-10-27: qty 20

## 2023-10-27 MED ORDER — SODIUM CHLORIDE 0.9 % IR SOLN
Status: DC | PRN
  Administered 2023-10-27: 3000 mL via INTRAVESICAL

## 2023-10-27 MED ORDER — PHENYLEPHRINE HCL-NACL 20-0.9 MG/250ML-% IV SOLN
INTRAVENOUS | Status: AC
  Filled 2023-10-27: qty 250

## 2023-10-27 MED ORDER — LIDOCAINE HCL (CARDIAC) PF 100 MG/5ML IV SOSY
PREFILLED_SYRINGE | INTRAVENOUS | Status: DC | PRN
  Administered 2023-10-27: 100 mg via INTRAVENOUS

## 2023-10-27 MED ORDER — SULFAMETHOXAZOLE-TRIMETHOPRIM 800-160 MG PO TABS: 1.0000 | ORAL_TABLET | Freq: Two times a day (BID) | ORAL | 0 refills | Status: AC

## 2023-10-27 MED ORDER — IOHEXOL 300 MG/ML  SOLN
INTRAMUSCULAR | Status: DC | PRN
  Administered 2023-10-27: 15 mL

## 2023-10-27 MED ORDER — GENTAMICIN SULFATE 40 MG/ML IJ SOLN
5.0000 mg/kg | INTRAVENOUS | Status: AC
  Administered 2023-10-27: 458.4 mg via INTRAVENOUS
  Filled 2023-10-27: qty 11.5

## 2023-10-27 SURGICAL SUPPLY — 19 items
BAG URO CATCHER STRL LF (MISCELLANEOUS) ×1 IMPLANT
BASKET LASER NITINOL 1.9FR (BASKET) IMPLANT
CATH URETL OPEN END 6FR 70 (CATHETERS) ×1 IMPLANT
CLOTH BEACON ORANGE TIMEOUT ST (SAFETY) ×1 IMPLANT
GLOVE SURG LX STRL 7.5 STRW (GLOVE) ×1 IMPLANT
GOWN STRL REUS W/ TWL XL LVL3 (GOWN DISPOSABLE) ×1 IMPLANT
GUIDEWIRE ANG ZIPWIRE 038X150 (WIRE) ×1 IMPLANT
GUIDEWIRE STR DUAL SENSOR (WIRE) ×1 IMPLANT
KIT TURNOVER KIT A (KITS) ×1 IMPLANT
MANIFOLD NEPTUNE II (INSTRUMENTS) ×1 IMPLANT
NS IRRIG 1000ML POUR BTL (IV SOLUTION) IMPLANT
PACK CYSTO (CUSTOM PROCEDURE TRAY) ×1 IMPLANT
SHEATH NAVIGATOR HD 11/13X28 (SHEATH) IMPLANT
SHEATH NAVIGATOR HD 11/13X36 (SHEATH) IMPLANT
STENT POLARIS 5FRX26 (STENTS) IMPLANT
TRACTIP FLEXIVA PULS ID 200XHI (Laser) IMPLANT
TUBE PU 8FR 16IN ENFIT (TUBING) ×1 IMPLANT
TUBING CONNECTING 10 (TUBING) ×1 IMPLANT
TUBING UROLOGY SET (TUBING) ×1 IMPLANT

## 2023-10-27 NOTE — Transfer of Care (Signed)
 Immediate Anesthesia Transfer of Care Note  Patient: Logan Shepherd  Procedure(s) Performed: CYSTOSCOPY/URETEROSCOPY/HOLMIUM LASER/STENT PLACEMENT (Left: Pelvis) CYSTOSCOPY, WITH RETROGRADE PYELOGRAM (Left: Pelvis)  Patient Location: PACU  Anesthesia Type:General  Level of Consciousness: awake, alert , oriented, and patient cooperative  Airway & Oxygen Therapy: Patient Spontanous Breathing and Patient connected to face mask oxygen  Post-op Assessment: Report given to RN and Post -op Vital signs reviewed and stable  Post vital signs: Reviewed and stable  Last Vitals:  Vitals Value Taken Time  BP 138/76 10/27/23 16:21  Temp    Pulse 78 10/27/23 16:23  Resp 19 10/27/23 16:23  SpO2 96 % 10/27/23 16:23  Vitals shown include unfiled device data.  Last Pain:  Vitals:   10/27/23 1354  TempSrc:   PainSc: 0-No pain      Patients Stated Pain Goal: 4 (10/27/23 1343)  Complications: No notable events documented.

## 2023-10-27 NOTE — Anesthesia Postprocedure Evaluation (Signed)
 Anesthesia Post Note  Patient: Logan Shepherd  Procedure(s) Performed: CYSTOSCOPY/URETEROSCOPY/HOLMIUM LASER/STENT PLACEMENT (Left: Pelvis) CYSTOSCOPY, WITH RETROGRADE PYELOGRAM (Left: Pelvis)     Patient location during evaluation: PACU Anesthesia Type: General Level of consciousness: awake and alert Pain management: pain level controlled Vital Signs Assessment: post-procedure vital signs reviewed and stable Respiratory status: spontaneous breathing, nonlabored ventilation, respiratory function stable and patient connected to nasal cannula oxygen Cardiovascular status: blood pressure returned to baseline and stable Postop Assessment: no apparent nausea or vomiting Anesthetic complications: no   No notable events documented.  Last Vitals:  Vitals:   10/27/23 1707 10/27/23 1743  BP: 132/68 (!) 156/88  Pulse: 63   Resp:  14  Temp:  (!) 36.3 C  SpO2: 97% 98%    Last Pain:  Vitals:   10/27/23 1707  TempSrc:   PainSc: 0-No pain                 Garnette FORBES Skillern

## 2023-10-27 NOTE — Op Note (Signed)
 NAME: Logan, Shepherd MEDICAL RECORD NO: [id number] ACCOUNT NO: [id number] DATE OF BIRTH: [birth year] FACILITY: [facility] LOCATION: [facility] PHYSICIAN: Ricardo Likens, MD  Operative Report   DATE OF PROCEDURE: 10/27/2023  SURGEON: Ricardo Likens, MD  PREOPERATIVE DIAGNOSES: 1.  Left renal stone. 2.  History of recurrent urinary tract infections.  PROCEDURE PERFORMED: 1.  Cystoscopy left retrograde pyelogram interpretation. 2.  Left ureteroscopy with laser lithotripsy. 3.  Insertion of left ureteral stent. .  ESTIMATED BLOOD LOSS:  Nil.  COMPLICATIONS:  None.  SPECIMEN:  Left renal stone fragments for composition analysis.  FINDINGS:  1.  Approximately 12-mm stone and very acutely angled lower pole calyx with a narrow infundibulum with proteinaceous debris.  Consistent with likely colonized stone.  2.  Complete resolution of all accessible stone fragments larger than one third mm following laser lithotripsy and basket extraction. 3.  Successful placement of left ureteral stent, proximal end in renal pelvis, distal end in the urinary bladder.  No tether.  INDICATIONS:  The patient is a pleasant 73 year old man with a history of occasional cystitis, sometimes fairly severe, but not frankly septic.  He was found on evaluation of this to have a left intrarenal stone that was not obviously obstructing, but it  increased in size over time.  This was concerning for a colonized nidus as he has no obvious other predisposing factors for his infections.  Options were discussed for management including continued p.r.n. treatment of his cystitis versus addressing his  stone with the goal of decreasing his future infection risk, and he wished to proceed with treating the stone with ureteroscopy. He presents today for this procedure.  Informed consent was obtained and placed in the medical record.  DESCRIPTION OF PROCEDURE:  The patient being Logan Shepherd was identified and the procedure being left  ureteroscopic stone manipulation was confirmed.  Procedure time out was performed.  Intravenous antibiotics administered.  General LMA anesthesia was  induced.  The patient was placed into a low lithotomy position.  Sterile fields were created prepped and draped the patient's penis, perineum and proximal thighs using iodine.  Cystoscopy was performed using a 21-French with rigid cystoscope with offset  lens.  There was some mild focal narrowing in the bulbar urethra, but this did accommodate the cystoscope, not a frank stricture.  The urinary bladder was unremarkable.  Bilateral ureteral orifices were identified and were single.  No papillary lesions,  diverticula, or calcifications were noted.  The left ureteral orifice was cannulated with a 6-French open-ended catheter and a left retrograde pyelogram was performed.  Left retrograde pyelogram demonstrated single left ureter, single system left kidney.  No obvious filling defects or narrowing noted.  The infundibulum was noted to be quite long and delicate.  A ZIPwire was advanced at the level of the upper pole and set aside as a safety wire.  An 8-French feeding tube was placed in the urinary bladder for pressure release.  A semi-rigid ureteroscopy was performed in the distal two-thirds of the left ureter  alongside the separate sensory working wire.  No mucosal abnormalities were found.  A semi-rigid scope exchanged for a medium-length ureteral access sheath to the level of the proximal ureter using continuous fluoroscopic guidance.  Flexible digital  ureteroscopy was performed of the proximal left ureter, to and including, systematic inspection of left kidney, including all calyces x 3.  A single dominant stone was noted in the incredibly acutely angled lower pole calyx with a narrow infundibulum.  This was incredibly  difficult to visualize due to the angularity and narrow infundibulum. The stone was too large for basketing. It was covered in some  proteinaceous debris, likely assuming to colonization.  A Holmium laser was introduced and applied to  the stone, at first using a setting of 0.2 joules and 20 Hz.  Approximately 30% of the stone was dusted and the remaining bits were fragmented.  The fragments being amenable to somewhat tedious and basketing with an Escape basket such that all accessible  stone fragments had been removed.  Given the location of the stone, there was some also residual stone material that as I said was quite small.  A popcorn technique was used at settings of 2 joules and 10 Hz, and the stone fragments were popcorned  into pieces less than one-third mm.  We achieved the goal of surgery today.  The access sheath was removed under continuous vision.  No significant mucosal abnormalities were found.  Given the relatively large stone volume and acutely angled calyx, it  was felt that interval stenting with a non-tethered stent would be most prudent to facilitate maximal stone passage.  A new 5 x 26 Polaris type stent was carefully placed over a safety wire using fluoroscopic guidance.  Good proximal and distal planes  were noted.  The procedure was terminated.  The patient tolerated the procedure well.  No immediate periprocedural complications.  The patient was taken to post anesthesia care unit in stable condition.  Plan for discharge home.   MUK D: 10/27/2023 4:19:10 pm T: 10/27/2023 11:22:00 pm  JOB: [id number]/ [id number]

## 2023-10-27 NOTE — Anesthesia Procedure Notes (Signed)
 Procedure Name: LMA Insertion Date/Time: 10/27/2023 3:22 PM  Performed by: Nada Corean CROME, CRNAPre-anesthesia Checklist: Emergency Drugs available, Patient identified, Suction available, Patient being monitored and Timeout performed Patient Re-evaluated:Patient Re-evaluated prior to induction Oxygen Delivery Method: Circle system utilized Preoxygenation: Pre-oxygenation with 100% oxygen Induction Type: IV induction Ventilation: Mask ventilation without difficulty LMA: LMA inserted LMA Size: 4.0 Tube type: Oral Number of attempts: 1 Placement Confirmation: positive ETCO2 and breath sounds checked- equal and bilateral Tube secured with: Tape Dental Injury: Teeth and Oropharynx as per pre-operative assessment

## 2023-10-27 NOTE — Anesthesia Preprocedure Evaluation (Addendum)
 Anesthesia Evaluation  Patient identified by MRN, date of birth, ID band Patient awake    Reviewed: Allergy & Precautions, NPO status , Patient's Chart, lab work & pertinent test results  Airway Mallampati: II  TM Distance: >3 FB Neck ROM: Full    Dental  (+) Dental Advisory Given, Poor Dentition, Missing   Pulmonary neg pulmonary ROS   Pulmonary exam normal breath sounds clear to auscultation       Cardiovascular hypertension, Pt. on medications Normal cardiovascular exam Rhythm:Regular Rate:Normal     Neuro/Psych negative neurological ROS     GI/Hepatic negative GI ROS, Neg liver ROS,,,  Endo/Other  Hypothyroidism  Obesity   Renal/GU Renal InsufficiencyRenal disease (left renal stone)     Musculoskeletal  (+) Arthritis ,    Abdominal   Peds  Hematology  (+) Blood dyscrasia, anemia   Anesthesia Other Findings Day of surgery medications reviewed with the patient.  Reproductive/Obstetrics                              Anesthesia Physical Anesthesia Plan  ASA: 2  Anesthesia Plan: General   Post-op Pain Management: Tylenol  PO (pre-op)*   Induction: Intravenous  PONV Risk Score and Plan: 3 and Dexamethasone , Ondansetron  and Treatment may vary due to age or medical condition  Airway Management Planned: LMA  Additional Equipment:   Intra-op Plan:   Post-operative Plan: Extubation in OR  Informed Consent: I have reviewed the patients History and Physical, chart, labs and discussed the procedure including the risks, benefits and alternatives for the proposed anesthesia with the patient or authorized representative who has indicated his/her understanding and acceptance.     Dental advisory given  Plan Discussed with: CRNA  Anesthesia Plan Comments:          Anesthesia Quick Evaluation

## 2023-10-27 NOTE — Discharge Instructions (Signed)
 1 - You may have urinary urgency (bladder spasms) and bloody urine on / off with stent in place. This is normal.  2 - Call MD or go to ER for fever >102, severe pain / nausea / vomiting not relieved by medications, or acute change in medical status

## 2023-10-27 NOTE — H&P (Signed)
 Logan Shepherd is an 73 y.o. male.    Chief Complaint: Pre-OP LEFT Ureteroscopic Stone Manipulation  HPI:   1 - Non-Obstructing Left Lower Pole Stone - 11mm LLP stone incidental on imaging 09/2023. Solitary.Had had prior SWL years ago.   2 - Rare Occasional Cystitis - e. coli cystitis 2020 and again 2025, prior CCX sens bactrim  / nitro / gent / cipro , but RES cephs. CT as per above. PVR 09/2023 23 mL (normal). Prostate 40gm (NOT enlarged) on CT 2025.   PMH sig for mild obesity, gout. NO CV disease / blood thinners. Reitred from [facility] in [place] near [place]. His PCP is Dibas Koirala MD.   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 NO interval fevers. He has been on CX-specific bactrim  pre-op to reduce colonization.   Past Medical History:  Diagnosis Date   Arthritis    History of kidney stones    Hyperlipidemia    Hypertension    Hypothyroidism    Nephrolithiasis    Neuropathy    Pre-diabetes    Renal insufficiency     Past Surgical History:  Procedure Laterality Date   BACK SURGERY     lumbar   kidney stone removal      NECK SURGERY     x 4   right knee surgery      x 3   toenail surgery       Family History  Problem Relation Age of Onset   Kidney Stones Father    CAD Father    Heart attack Brother 48   Social History:  reports that he has never smoked. He has never used smokeless tobacco. He reports that he does not drink alcohol and does not use drugs.  Allergies:  Allergies  Allergen Reactions   Codeine Nausea Only   Keflex [Cephalexin] Nausea Only and Swelling    Facial Swelling   Penicillins Swelling   Nubain [Nalbuphine] Anxiety    No medications prior to admission.    No results found for this or any previous visit (from the past 48 hours). No results found.  Review of Systems  Constitutional:  Negative for chills and fever.  All other systems  reviewed and are negative.   There were no vitals taken for this visit. Physical Exam Vitals reviewed.  HENT:     Head: Normocephalic.     Nose: Nose normal.  Cardiovascular:     Rate and Rhythm: Normal rate.  Pulmonary:     Effort: Pulmonary effort is normal.  Genitourinary:    Comments: No CVAT at present Musculoskeletal:        General: Normal range of motion.     Cervical back: Normal range of motion.  Skin:    General: Skin is warm.  Neurological:     General: No focal deficit present.     Mental Status: He is alert.  Psychiatric:        Mood and Affect: Mood normal.      Assessment/Plan  Proceed as planned with LEFT ureteroscopic stone manipulation. Risks, benefits, alternatives, expected peri-op course discussed previously and reiterated today.   Ricardo KATHEE Alvaro Mickey., MD 10/27/2023, 9:30 AM

## 2023-10-27 NOTE — Brief Op Note (Signed)
 10/27/2023  4:13 PM  PATIENT:  Logan Shepherd  73 y.o. male  PRE-OPERATIVE DIAGNOSIS:  left renal stone  POST-OPERATIVE DIAGNOSIS:  left renal stone  PROCEDURE:  Procedure(s): CYSTOSCOPY/URETEROSCOPY/HOLMIUM LASER/STENT PLACEMENT (Left) CYSTOSCOPY, WITH RETROGRADE PYELOGRAM (Left)  SURGEON:  Surgeons and Role:    * Manny, Ricardo KATHEE Raddle., MD - Primary  PHYSICIAN ASSISTANT:   ASSISTANTS: none   ANESTHESIA:   general  EBL:  minimal   BLOOD ADMINISTERED:none  DRAINS: none   LOCAL MEDICATIONS USED:  NONE  SPECIMEN:  Source of Specimen:  left renal stone fragments  DISPOSITION OF SPECIMEN:  [facility] for compositional analysis  COUNTS:  YES  TOURNIQUET:  * No tourniquets in log *  DICTATION: .Other Dictation: Dictation Number [id number]  PLAN OF CARE: Discharge to home after PACU  PATIENT DISPOSITION:  PACU - hemodynamically stable.   Delay start of Pharmacological VTE agent (>24hrs) due to surgical blood loss or risk of bleeding: yes

## 2023-10-28 ENCOUNTER — Encounter (HOSPITAL_COMMUNITY): Payer: Self-pay | Admitting: Urology

## 2023-11-13 DIAGNOSIS — N200 Calculus of kidney: Secondary | ICD-10-CM | POA: Diagnosis not present

## 2024-01-04 ENCOUNTER — Other Ambulatory Visit: Payer: Self-pay | Admitting: Neurology

## 2024-01-04 DIAGNOSIS — R251 Tremor, unspecified: Secondary | ICD-10-CM

## 2024-01-04 DIAGNOSIS — G243 Spasmodic torticollis: Secondary | ICD-10-CM

## 2024-03-17 ENCOUNTER — Other Ambulatory Visit: Payer: Self-pay | Admitting: Neurology

## 2024-03-17 DIAGNOSIS — G243 Spasmodic torticollis: Secondary | ICD-10-CM

## 2024-03-17 DIAGNOSIS — R251 Tremor, unspecified: Secondary | ICD-10-CM

## 2024-04-03 ENCOUNTER — Ambulatory Visit: Admitting: Neurology

## 2024-04-11 ENCOUNTER — Ambulatory Visit: Payer: Medicare HMO | Admitting: Neurology
